# Patient Record
Sex: Female | Born: 1965
Health system: Southern US, Community
[De-identification: ages and names within clinical notes are randomized; demographics above are authoritative.]

## PROBLEM LIST (undated history)

## (undated) DIAGNOSIS — E039 Hypothyroidism, unspecified: Secondary | ICD-10-CM

## (undated) DIAGNOSIS — Z9109 Other allergy status, other than to drugs and biological substances: Secondary | ICD-10-CM

## (undated) DIAGNOSIS — F419 Anxiety disorder, unspecified: Secondary | ICD-10-CM

## (undated) HISTORY — DX: Hypothyroidism, unspecified: E03.9

## (undated) HISTORY — DX: Other allergy status, other than to drugs and biological substances: Z91.09

## (undated) HISTORY — DX: Anxiety disorder, unspecified: F41.9

---

## 2004-10-19 ENCOUNTER — Ambulatory Visit: Payer: Self-pay | Admitting: Internal Medicine

## 2005-11-01 ENCOUNTER — Ambulatory Visit: Payer: Self-pay | Admitting: Internal Medicine

## 2007-07-29 ENCOUNTER — Ambulatory Visit: Payer: Self-pay | Admitting: Internal Medicine

## 2009-09-27 ENCOUNTER — Ambulatory Visit: Payer: Self-pay | Admitting: Internal Medicine

## 2010-09-04 ENCOUNTER — Encounter (INDEPENDENT_AMBULATORY_CARE_PROVIDER_SITE_OTHER): Payer: PRIVATE HEALTH INSURANCE | Admitting: Ophthalmology

## 2010-09-04 DIAGNOSIS — D3130 Benign neoplasm of unspecified choroid: Secondary | ICD-10-CM

## 2010-11-07 ENCOUNTER — Ambulatory Visit: Payer: Self-pay

## 2011-03-07 ENCOUNTER — Ambulatory Visit (INDEPENDENT_AMBULATORY_CARE_PROVIDER_SITE_OTHER): Payer: PRIVATE HEALTH INSURANCE | Admitting: Ophthalmology

## 2011-03-19 ENCOUNTER — Ambulatory Visit (INDEPENDENT_AMBULATORY_CARE_PROVIDER_SITE_OTHER): Payer: PRIVATE HEALTH INSURANCE | Admitting: Ophthalmology

## 2011-03-19 DIAGNOSIS — H43819 Vitreous degeneration, unspecified eye: Secondary | ICD-10-CM

## 2011-03-19 DIAGNOSIS — D3130 Benign neoplasm of unspecified choroid: Secondary | ICD-10-CM

## 2011-09-17 ENCOUNTER — Ambulatory Visit (INDEPENDENT_AMBULATORY_CARE_PROVIDER_SITE_OTHER): Payer: PRIVATE HEALTH INSURANCE | Admitting: Ophthalmology

## 2011-09-17 DIAGNOSIS — H43819 Vitreous degeneration, unspecified eye: Secondary | ICD-10-CM

## 2011-09-17 DIAGNOSIS — D3130 Benign neoplasm of unspecified choroid: Secondary | ICD-10-CM

## 2011-11-08 ENCOUNTER — Ambulatory Visit: Payer: Self-pay

## 2012-03-19 ENCOUNTER — Ambulatory Visit (INDEPENDENT_AMBULATORY_CARE_PROVIDER_SITE_OTHER): Payer: PRIVATE HEALTH INSURANCE | Admitting: Ophthalmology

## 2012-11-18 ENCOUNTER — Ambulatory Visit: Payer: Self-pay

## 2015-02-21 ENCOUNTER — Ambulatory Visit
Admission: RE | Admit: 2015-02-21 | Discharge: 2015-02-21 | Disposition: A | Discharge status: Home / Self Care | Payer: Managed Care, Other (non HMO) | Source: Ambulatory Visit | Attending: Internal Medicine | Admitting: Internal Medicine

## 2015-02-21 ENCOUNTER — Other Ambulatory Visit: Payer: Self-pay | Admitting: Internal Medicine

## 2015-02-21 DIAGNOSIS — M79671 Pain in right foot: Secondary | ICD-10-CM

## 2017-01-08 ENCOUNTER — Ambulatory Visit: Payer: 59 | Admitting: Internal Medicine

## 2017-01-08 ENCOUNTER — Encounter: Payer: Self-pay | Admitting: Internal Medicine

## 2017-01-08 VITALS — BP 136/90 | HR 75 | Resp 16 | Ht 69.0 in | Wt 219.4 lb

## 2017-01-08 DIAGNOSIS — F411 Generalized anxiety disorder: Secondary | ICD-10-CM | POA: Insufficient documentation

## 2017-01-08 DIAGNOSIS — E039 Hypothyroidism, unspecified: Secondary | ICD-10-CM | POA: Diagnosis not present

## 2017-01-08 DIAGNOSIS — N926 Irregular menstruation, unspecified: Secondary | ICD-10-CM | POA: Insufficient documentation

## 2017-01-08 DIAGNOSIS — R635 Abnormal weight gain: Secondary | ICD-10-CM | POA: Insufficient documentation

## 2017-01-08 DIAGNOSIS — M722 Plantar fascial fibromatosis: Secondary | ICD-10-CM | POA: Insufficient documentation

## 2017-01-08 DIAGNOSIS — F325 Major depressive disorder, single episode, in full remission: Secondary | ICD-10-CM

## 2017-01-08 DIAGNOSIS — J309 Allergic rhinitis, unspecified: Secondary | ICD-10-CM | POA: Insufficient documentation

## 2017-01-08 DIAGNOSIS — E559 Vitamin D deficiency, unspecified: Secondary | ICD-10-CM | POA: Insufficient documentation

## 2017-01-08 NOTE — Progress Notes (Signed)
[facility] [place], [place] [address]  Internal MEDICINE  Office Visit Note  Patient Name: Chelsea Sheppard  [id number]  [id number]  Date of Service: 01/08/2017     Complaints/HPI:  Pt is feeling well. Mammogram and pap smear is up to date   Current Medication: Outpatient Encounter Medications as of 01/08/2017  Medication Sig  . albuterol (PROVENTIL HFA;VENTOLIN HFA) 108 (90 Base) MCG/ACT inhaler Inhale 2 puffs into the lungs every 4 (four) hours as needed for wheezing or shortness of breath.  . ALPRAZolam (XANAX) 0.5 MG tablet Take 0.5 mg by mouth at bedtime as needed for anxiety.  Marland Kitchen buPROPion (WELLBUTRIN) 75 MG tablet Take 75 mg by mouth 3 (three) times daily. 2 tablets po QAM and 1 tablet po QPM  . fluticasone (FLONASE) 50 MCG/ACT nasal spray Place 1 spray into both nostrils daily.  Marland Kitchen levothyroxine (SYNTHROID, LEVOTHROID) 125 MCG tablet Take 125 mcg by mouth daily before breakfast.   No facility-administered encounter medications on file as of 01/08/2017.     Surgical History: History reviewed. No pertinent surgical history.  Medical History: Past Medical History:  Diagnosis Date  . Environmental allergies   . Hypothyroidism     Family History: Family History  Problem Relation Age of Onset  . Diabetes Mother   . Thyroid disease Mother   . Hypertension Mother   . Kidney cancer Mother        tumor on kidney and had kidney removed  . Hypertension Father   . Prostate cancer Father       ROS  General: (-) fever, (-) chills, (-) night sweats, (-) weakness, (-) changes in appetite. Skin: (-) rashes, (-) itching,. Eyes: (-) visual changes, (-) redness, (-) itching, (-) double or blurred vision. Nose and Sinuses: (-) nasal stuffiness or itchiness, (-) postnasal drip, (-) nosebleeds, (-) sinus trouble. Mouth and Throat: (-) sore throat, (-) hoarseness. Neck: (-) swollen glands, (-) enlarged thyroid, (-) neck pain. Respiratory: (-) cough,  (-) bloody sputum, (-) shortness of breath, (-) wheezing. Cardiovascular: (-) ankle swelling, (-) chest pain. Lymphatic: (-) lymph node enlargement, (-) lymph node tenderness. Neurologic: (-) numbness, (-) tingling,(-) dizziness. Psychiatric: (-) anxiety, (-) depression.  Vital Signs: Blood pressure 136/90, pulse 75, resp. rate 16, height 5\' 9"  (1.753 m), weight 219 lb 6.4 oz (99.5 kg), SpO2 96 %.  Examination: General Appearance: The patient is well-developed, well-nourished, and in no distress. Skin: Gross inspection of skin demonstrates no evidence of abnormality. Head: Patient's head is normocephalic, no gross deformities. Eyes: no gross deformities noted. ENT: ears appear grossly normal. Nasopharynx appears to be normal. Neck: Supple. No thyromegaly. No LAD. Respiratory: Lungs are clear to auscultation with no adventitious sounds. Cardiovascular: Normal S1 and S2 without murmur or rub. Extremities: No cyanosis. pulses are equal. Neurologic: Alert and oriented. No involuntary movements.   Radiology: Dg Foot Complete Right  Result Date: 02/21/2015 CLINICAL DATA:  Right foot pain for 2 months.  No known injury. EXAM: RIGHT FOOT COMPLETE - 3+ VIEW COMPARISON:  None. FINDINGS: No acute bony or joint abnormality identified. No evidence fracture dislocation. Degenerative changes first MTP joint. IMPRESSION: Degenerative changes first MTP joint.  No acute abnormality. Electronically Signed   By: Marcello Moores  Register   On: 02/21/2015 13:55       Assessment and Plan: Patient Active Problem List   Diagnosis Date Noted  . Major depressive disorder, single episode, in remission ([place]) 01/08/2017  . Plantar fascial fibromatosis 01/08/2017  .  Abnormal weight gain 01/08/2017  . Vitamin D deficiency 01/08/2017  . Allergic rhinitis 01/08/2017  . Generalized anxiety disorder 01/08/2017  . Hypothyroidism 01/08/2017  . Irregular menstruation 01/08/2017  1. Major depressive disorder, single  episode, in remission ([place]) Continue all meds as before   2. Hypothyroidism, unspecified type  - T4, free - TSH If tsh is on the high side, might want to increase synthroid dose   General Counseling: I have discussed the findings of the evaluation and examination with Nevin Bloodgood.  I have also discussed any further diagnostic evaluation thatmay be needed or ordered today. Kearie verbalizes understanding of the findings of todays visit. We also reviewed her medications today and discussed drug interactions and side effects including but not limited excessive drowsiness and altered mental states. We also discussed that there is always a risk not just to her but also people around her. she has been encouraged to call the office with any questions or concerns that should arise related to todays visit.    Time spent 20  I have personally obtained a history, examined the patient, evaluated laboratory and imaging results, formulated the assessment and plan and placed orders.   Lavera Guise, MD  Internal Medicine

## 2017-04-01 ENCOUNTER — Other Ambulatory Visit: Payer: Self-pay

## 2017-04-01 MED ORDER — BUPROPION HCL 75 MG PO TABS: 75.0000 mg | ORAL_TABLET | Freq: Three times a day (TID) | ORAL | 1 refills | Status: DC

## 2017-07-02 LAB — T4, FREE: Free T4: 1.43 ng/dL (ref 0.82–1.77)

## 2017-07-02 LAB — TSH: TSH: 6.35 u[IU]/mL — ABNORMAL HIGH (ref 0.450–4.500)

## 2017-07-09 ENCOUNTER — Ambulatory Visit: Payer: Managed Care, Other (non HMO) | Admitting: Nurse Practitioner

## 2017-07-09 ENCOUNTER — Encounter: Payer: Self-pay | Admitting: Nurse Practitioner

## 2017-07-09 VITALS — BP 123/83 | HR 83 | Resp 16 | Ht 69.0 in | Wt 223.6 lb

## 2017-07-09 DIAGNOSIS — E559 Vitamin D deficiency, unspecified: Secondary | ICD-10-CM

## 2017-07-09 DIAGNOSIS — R635 Abnormal weight gain: Secondary | ICD-10-CM

## 2017-07-09 DIAGNOSIS — Z1239 Encounter for other screening for malignant neoplasm of breast: Secondary | ICD-10-CM

## 2017-07-09 DIAGNOSIS — Z1231 Encounter for screening mammogram for malignant neoplasm of breast: Secondary | ICD-10-CM

## 2017-07-09 DIAGNOSIS — Z124 Encounter for screening for malignant neoplasm of cervix: Secondary | ICD-10-CM | POA: Insufficient documentation

## 2017-07-09 DIAGNOSIS — F325 Major depressive disorder, single episode, in full remission: Secondary | ICD-10-CM

## 2017-07-09 DIAGNOSIS — E039 Hypothyroidism, unspecified: Secondary | ICD-10-CM

## 2017-07-09 MED ORDER — LEVOTHYROXINE SODIUM 137 MCG PO TABS: 125.0000 ug | ORAL_TABLET | Freq: Every day | ORAL | 1 refills | Status: DC

## 2017-07-09 MED ORDER — LEVOTHYROXINE SODIUM 137 MCG PO TABS: 125.0000 ug | ORAL_TABLET | Freq: Every day | ORAL | 3 refills | Status: DC

## 2017-07-09 NOTE — Progress Notes (Addendum)
[facility] [place], [place] [address]  Internal MEDICINE  Office Visit Note  Patient Name: Chelsea Sheppard  [id number]  [id number]  Date of Service: 07/09/2017   Pt is here for routine follow up.  Chief Complaint  Patient presents with  . Hypothyroidism    13month follow up  . Hypertension    pt believes blood pressure has been higher than it should be    Hypertension  This is a chronic problem. The current episode started more than 1 year ago. The problem is unchanged. The problem is controlled. Associated symptoms include palpitations. Pertinent negatives include no chest pain, neck pain or shortness of breath. Agents associated with hypertension include thyroid hormones. Risk factors for coronary artery disease include obesity, post-menopausal state and family history. Past treatments include lifestyle changes. The current treatment provides moderate improvement. There are no compliance problems.         Current Medication: Outpatient Encounter Medications as of 07/09/2017  Medication Sig  . albuterol (PROVENTIL HFA;VENTOLIN HFA) 108 (90 Base) MCG/ACT inhaler Inhale 2 puffs into the lungs every 4 (four) hours as needed for wheezing or shortness of breath.  . ALPRAZolam (XANAX) 0.5 MG tablet Take 0.5 mg by mouth at bedtime as needed for anxiety.  Marland Kitchen buPROPion (WELLBUTRIN) 75 MG tablet Take 1 tablet (75 mg total) by mouth 3 (three) times daily. 2 tablets po QAM and 1 tablet po QPM  . fluticasone (FLONASE) 50 MCG/ACT nasal spray Place 1 spray into both nostrils daily.  Marland Kitchen levothyroxine (SYNTHROID, LEVOTHROID) 137 MCG tablet Take 1 tablet (137 mcg total) by mouth daily before breakfast.  . [DISCONTINUED] levothyroxine (SYNTHROID, LEVOTHROID) 125 MCG tablet Take 125 mcg by mouth daily before breakfast.  . [DISCONTINUED] levothyroxine (SYNTHROID, LEVOTHROID) 137 MCG tablet Take 1 tablet (137 mcg total) by mouth daily before breakfast.   No facility-administered  encounter medications on file as of 07/09/2017.     Surgical History: History reviewed. No pertinent surgical history.  Medical History: Past Medical History:  Diagnosis Date  . Environmental allergies   . Hypothyroidism     Family History: Family History  Problem Relation Age of Onset  . Diabetes Mother   . Thyroid disease Mother   . Hypertension Mother   . Kidney cancer Mother        tumor on kidney and had kidney removed  . Hypertension Father   . Prostate cancer Father     Social History   Socioeconomic History  . Marital status: Unknown    Spouse name: Not on file  . Number of children: Not on file  . Years of education: Not on file  . Highest education level: Not on file  Occupational History  . Not on file  Social Needs  . Financial resource strain: Not on file  . Food insecurity:    Worry: Not on file    Inability: Not on file  . Transportation needs:    Medical: Not on file    Non-medical: Not on file  Tobacco Use  . Smoking status: Former Smoker    Types: Cigarettes  . Smokeless tobacco: Never Used  Substance and Sexual Activity  . Alcohol use: Yes    Comment: social  . Drug use: No  . Sexual activity: Not on file  Lifestyle  . Physical activity:    Days per week: Not on file    Minutes per session: Not on file  . Stress: Not on file  Relationships  .  Social connections:    Talks on phone: Not on file    Gets together: Not on file    Attends religious service: Not on file    Active member of club or organization: Not on file    Attends meetings of clubs or organizations: Not on file    Relationship status: Not on file  . Intimate partner violence:    Fear of current or ex partner: Not on file    Emotionally abused: Not on file    Physically abused: Not on file    Forced sexual activity: Not on file  Other Topics Concern  . Not on file  Social History Narrative  . Not on file      Review of Systems  Constitutional: Negative for  chills, fatigue and unexpected weight change.  HENT: Negative for congestion, postnasal drip, rhinorrhea, sneezing and sore throat.   Eyes: Negative.  Negative for redness.  Respiratory: Negative for cough, chest tightness and shortness of breath.   Cardiovascular: Positive for palpitations. Negative for chest pain.       Intermittent palpitations.   Gastrointestinal: Negative for abdominal pain, constipation, diarrhea, nausea and vomiting.  Endocrine: Negative for cold intolerance, heat intolerance, polydipsia, polyphagia and polyuria.       New labs indicate hypothyroid.  Genitourinary: Negative for dysuria and frequency.  Musculoskeletal: Negative for arthralgias, back pain, joint swelling and neck pain.  Skin: Negative for rash.  Allergic/Immunologic: Negative for environmental allergies.  Neurological: Negative.  Negative for tremors and numbness.  Hematological: Negative for adenopathy. Does not bruise/bleed easily.  Psychiatric/Behavioral: Negative for behavioral problems (Depression), sleep disturbance and suicidal ideas. The patient is nervous/anxious.        Well managed anxiety/depression on current medication.     Today's Vitals   07/09/17 0848  BP: 123/83  Pulse: 83  Resp: 16  SpO2: 96%  Weight: 223 lb 9.6 oz (101.4 kg)  Height: 5\' 9"  (1.753 m)    Physical Exam  Constitutional: She is oriented to person, place, and time. She appears well-developed and well-nourished. No distress.  HENT:  Head: Normocephalic and atraumatic.  Nose: Nose normal.  Mouth/Throat: Oropharynx is clear and moist. No oropharyngeal exudate.  Eyes: Pupils are equal, round, and reactive to light. Conjunctivae and EOM are normal.  Neck: Normal range of motion. Neck supple. No JVD present. No tracheal deviation present. No thyromegaly present.  Cardiovascular: Normal rate, regular rhythm and normal heart sounds. Exam reveals no gallop and no friction rub.  No murmur heard. Pulmonary/Chest: Effort  normal and breath sounds normal. No respiratory distress. She has no wheezes. She has no rales. She exhibits no tenderness.  Abdominal: Soft. Bowel sounds are normal. There is no tenderness.  Musculoskeletal: Normal range of motion.  Lymphadenopathy:    She has no cervical adenopathy.  Neurological: She is alert and oriented to person, place, and time. No cranial nerve deficit.  Skin: Skin is warm and dry. She is not diaphoretic.  Psychiatric: She has a normal mood and affect. Her behavior is normal. Judgment and thought content normal.  Nursing note and vitals reviewed.  Assessment/Plan:  1. Hypothyroidism, unspecified type Reviewed labs indicating hypothyroid. Increased levothyroxine to 115mcg daily. Recheck thyroid panel prior to next visit and adjust levothyroxine as indicated.  - CBC with Differential/Platelet; Future - Comprehensive metabolic panel; Future - T4, free; Future - TSH; Future - levothyroxine (SYNTHROID, LEVOTHROID) 137 MCG tablet; Take 1 tablet (137 mcg total) by mouth daily before breakfast.  Dispense:  90 tablet; Refill: 1  2. Major depressive disorder, single episode, in remission ([place]) Continue bupropion as prescribed.  - CBC with Differential/Platelet; Future - Comprehensive metabolic panel; Future  3. Abnormal weight gain Check labs. Adjust levothyroxine as indicated.  - Lipid panel; Future  4. Vitamin D deficiency - Vitamin D 1,25 dihydroxy; Future  5. Screening for breast cancer - MM DIGITAL SCREENING BILATERAL; Future  General Counseling: idara woodside understanding of the findings of todays visit and agrees with plan of treatment. I have discussed any further diagnostic evaluation that may be needed or ordered today. We also reviewed her medications today. she has been encouraged to call the office with any questions or concerns that should arise related to todays visit.    Counseling:    Orders Placed This Encounter  Procedures  . MM  DIGITAL SCREENING BILATERAL  . CBC with Differential/Platelet  . Comprehensive metabolic panel  . T4, free  . TSH  . Lipid panel  . Vitamin D 1,25 dihydroxy    Meds ordered this encounter  Medications  . DISCONTD: levothyroxine (SYNTHROID, LEVOTHROID) 137 MCG tablet    Sig: Take 1 tablet (137 mcg total) by mouth daily before breakfast.    Dispense:  30 tablet    Refill:  3    This is new, increased dose    Order Specific Question:   Supervising Provider    Answer:   Lavera Guise [place]  . levothyroxine (SYNTHROID, LEVOTHROID) 137 MCG tablet    Sig: Take 1 tablet (137 mcg total) by mouth daily before breakfast.    Dispense:  90 tablet    Refill:  1    This is new, increased dose    Order Specific Question:   Supervising Provider    Answer:   Lavera Guise [[id number]]    Time spent [place] [facility]

## 2017-08-15 ENCOUNTER — Encounter: Payer: Self-pay | Admitting: Radiology

## 2017-08-15 ENCOUNTER — Ambulatory Visit
Admission: RE | Admit: 2017-08-15 | Discharge: 2017-08-15 | Disposition: A | Discharge status: Home / Self Care | Payer: Managed Care, Other (non HMO) | Source: Ambulatory Visit | Attending: Nurse Practitioner | Admitting: Nurse Practitioner

## 2017-08-15 DIAGNOSIS — Z1239 Encounter for other screening for malignant neoplasm of breast: Secondary | ICD-10-CM

## 2017-08-15 DIAGNOSIS — Z1231 Encounter for screening mammogram for malignant neoplasm of breast: Secondary | ICD-10-CM | POA: Insufficient documentation

## 2017-08-19 ENCOUNTER — Other Ambulatory Visit: Payer: Self-pay | Admitting: Nurse Practitioner

## 2017-08-29 ENCOUNTER — Other Ambulatory Visit: Payer: Self-pay

## 2017-08-29 MED ORDER — BUPROPION HCL 75 MG PO TABS: 75.0000 mg | ORAL_TABLET | Freq: Three times a day (TID) | ORAL | 1 refills | Status: DC

## 2017-11-06 ENCOUNTER — Encounter: Payer: Self-pay | Admitting: Nurse Practitioner

## 2017-11-06 ENCOUNTER — Ambulatory Visit
Admission: RE | Admit: 2017-11-06 | Discharge: 2017-11-06 | Disposition: A | Discharge status: Home / Self Care | Payer: 59 | Source: Ambulatory Visit | Attending: Nurse Practitioner | Admitting: Nurse Practitioner

## 2017-11-06 ENCOUNTER — Ambulatory Visit: Payer: 59 | Admitting: Nurse Practitioner

## 2017-11-06 VITALS — BP 128/89 | HR 102 | Temp 96.2°F | Resp 16 | Ht 69.0 in | Wt 225.0 lb

## 2017-11-06 DIAGNOSIS — M25562 Pain in left knee: Secondary | ICD-10-CM | POA: Diagnosis present

## 2017-11-06 DIAGNOSIS — W101XXA Fall (on)(from) sidewalk curb, initial encounter: Secondary | ICD-10-CM | POA: Insufficient documentation

## 2017-11-06 DIAGNOSIS — M25462 Effusion, left knee: Secondary | ICD-10-CM | POA: Diagnosis not present

## 2017-11-06 MED ORDER — DICLOFENAC SODIUM 1 % TD GEL: 4.0000 g | Freq: Four times a day (QID) | TRANSDERMAL | 1 refills | Status: AC

## 2017-11-06 MED ORDER — TRAMADOL HCL 50 MG PO TABS: 50.0000 mg | ORAL_TABLET | Freq: Two times a day (BID) | ORAL | 0 refills | Status: DC | PRN

## 2017-11-06 NOTE — Progress Notes (Signed)
[facility] [place], [place] [address]  Internal MEDICINE  Office Visit Note  Patient Name: Chelsea Sheppard  [id number]  [id number]  Date of Service: 11/06/2017   Pt is here for a sick visit.  Chief Complaint  Patient presents with  . Hypothyroidism  . Knee Pain    left side     The patient states that she initially fell back in July while walking her dogs. Did talk to tele-doc through her insurance. Was told to ice and elevate her leg. Was also advised to use knee brace. She states that after several weeks, this improved. Then, last week, while walking with friends in the sidewalk in [place], she stepped up on the curb. She did not twist her knee, felt no pop, did not fall, but left knee began to hurt severely. By the next day, she was barely able to put weight on her leg at all. Hurts along the inner aspect and the lower part of the patella.   Knee Pain   The incident occurred 5 to 7 days ago. The incident occurred in the street. The injury mechanism is unknown. The pain is present in the left knee (lower left leg. ). The quality of the pain is described as aching and stabbing. The pain is at a severity of 7/10. The pain is moderate. The pain has been fluctuating since onset. Pertinent negatives include no numbness. She reports no foreign bodies present. The symptoms are aggravated by movement and weight bearing. She has tried ice, immobilization, NSAIDs and non-weight bearing for the symptoms. The treatment provided mild relief.        Current Medication:  Outpatient Encounter Medications as of 11/06/2017  Medication Sig  . albuterol (PROVENTIL HFA;VENTOLIN HFA) 108 (90 Base) MCG/ACT inhaler Inhale 2 puffs into the lungs every 4 (four) hours as needed for wheezing or shortness of breath.  . ALPRAZolam (XANAX) 0.5 MG tablet Take 0.5 mg by mouth at bedtime as needed for anxiety.  Marland Kitchen buPROPion (WELLBUTRIN) 75 MG tablet Take 1 tablet (75 mg total) by mouth  3 (three) times daily. 2 tablets po QAM and 1 tablet po QPM  . fluticasone (FLONASE) 50 MCG/ACT nasal spray Place 1 spray into both nostrils daily.  Marland Kitchen levothyroxine (SYNTHROID, LEVOTHROID) 137 MCG tablet Take 1 tablet (137 mcg total) by mouth daily before breakfast.  . diclofenac sodium (VOLTAREN) 1 % GEL Apply 4 g topically 4 (four) times daily.  . traMADol (ULTRAM) 50 MG tablet Take 1 tablet (50 mg total) by mouth every 12 (twelve) hours as needed.   No facility-administered encounter medications on file as of 11/06/2017.       Medical History: Past Medical History:  Diagnosis Date  . Environmental allergies   . Hypothyroidism      Vital Signs: BP 128/89   Pulse (!) 102   Temp (!) 96.2 F (35.7 C)   Resp 16   Ht 5\' 9"  (1.753 m)   Wt 225 lb (102.1 kg)   LMP 10/27/2017   SpO2 98%   BMI 33.23 kg/m    Review of Systems  Constitutional: Positive for activity change. Negative for chills, fatigue and unexpected weight change.       Hurting to walk and is walking with considerable limp, favoring the left side .  HENT: Negative for congestion.   Eyes: Negative for redness.  Respiratory: Negative for cough, chest tightness and shortness of breath.   Cardiovascular: Negative for chest pain and palpitations.  Musculoskeletal: Positive for arthralgias. Negative for back pain, joint swelling and neck pain.       Moderate to severe pain in left knee. Hurts most with any weight on the left leg. Walking is extremely painful.   Skin: Negative for rash.  Neurological: Negative.  Negative for tremors and numbness.  Hematological: Negative for adenopathy. Does not bruise/bleed easily.  Psychiatric/Behavioral: Behavioral problem: Depression.    Physical Exam  Constitutional: She is oriented to person, place, and time. She appears well-developed and well-nourished. No distress.  HENT:  Head: Normocephalic and atraumatic.  Mouth/Throat: No oropharyngeal exudate.  Eyes: Pupils are  equal, round, and reactive to light. EOM are normal.  Neck: Normal range of motion. Neck supple. No JVD present. No tracheal deviation present. No thyromegaly present.  Cardiovascular: Normal rate, regular rhythm and normal heart sounds. Exam reveals no gallop and no friction rub.  No murmur heard. Pulmonary/Chest: No respiratory distress. She has no wheezes. She has no rales. She exhibits no tenderness.  Musculoskeletal: Normal range of motion.       Legs: Lymphadenopathy:    She has no cervical adenopathy.  Neurological: She is alert and oriented to person, place, and time. No cranial nerve deficit.  Skin: Skin is warm and dry. She is not diaphoretic.  Psychiatric: She has a normal mood and affect.  Nursing note and vitals reviewed.  Assessment/Plan: 1. Fall (on)(from) sidewalk curb, initial encounter Will get x-ray of left knee for further evaluation. Will consider MRI and/or refer to orthopedics as indicated.  - DG Knee Complete 4 Views Left; Future  2. Acute pain of left knee Will get x-ray of left knee for further evaluation. Will consider MRI and/or refer to orthopedics as indicated. Prescription for voltaren gel may be used as needed for pain. Tramadol 50mg  written for twice daily, however, patient to use only at bedtime. A prescription for #10 tablets was sent to her pharmacy.  - DG Knee Complete 4 Views Left; Future   General Counseling: Chelsea Sheppard understanding of the findings of todays visit and agrees with plan of treatment. I have discussed any further diagnostic evaluation that may be needed or ordered today. We also reviewed her medications today. she has been encouraged to call the office with any questions or concerns that should arise related to todays visit.    Counseling:  Reviewed risks and possible side effects associated with taking opiates, benzodiazepines and other CNS depressants. Combination of these could cause dizziness and drowsiness. Advised patient  not to drive or operate machinery when taking these medications, as patient's and other's life can be at risk and will have consequences. Patient verbalized understanding in this matter. Dependence and abuse for these drugs will be monitored closely. A Controlled substance policy and procedure is on file which allows [facility] to order a urine drug screen test at any visit. Patient understands and agrees with the plan  This patient was seen by Leretha Pol FNP Collaboration with Dr Lavera Guise as a part of collaborative care agreement  Orders Placed This Encounter  Procedures  . DG Knee Complete 4 Views Left    Meds ordered this encounter  Medications  . diclofenac sodium (VOLTAREN) 1 % GEL    Sig: Apply 4 g topically 4 (four) times daily.    Dispense:  100 g    Refill:  1    Order Specific Question:   Supervising Provider    Answer:   Lavera Guise [[id number]]  .  traMADol (ULTRAM) 50 MG tablet    Sig: Take 1 tablet (50 mg total) by mouth every 12 (twelve) hours as needed.    Dispense:  10 tablet    Refill:  0    Order Specific Question:   Supervising Provider    Answer:   Lavera Guise [[id number]]    Time spent: 15 Minutes

## 2017-11-07 ENCOUNTER — Telehealth: Payer: Self-pay

## 2017-11-07 NOTE — Telephone Encounter (Signed)
Pt advised for xrays result and send beth reminder for ortho referral

## 2017-12-04 ENCOUNTER — Other Ambulatory Visit: Payer: Self-pay

## 2017-12-04 MED ORDER — BUPROPION HCL 75 MG PO TABS: 75.0000 mg | ORAL_TABLET | Freq: Three times a day (TID) | ORAL | 1 refills | Status: DC

## 2017-12-18 ENCOUNTER — Other Ambulatory Visit: Payer: Self-pay

## 2017-12-18 DIAGNOSIS — E039 Hypothyroidism, unspecified: Secondary | ICD-10-CM

## 2017-12-18 MED ORDER — LEVOTHYROXINE SODIUM 137 MCG PO TABS: 125.0000 ug | ORAL_TABLET | Freq: Every day | ORAL | 2 refills | Status: DC

## 2018-01-17 ENCOUNTER — Encounter: Payer: Self-pay | Admitting: Nurse Practitioner

## 2018-01-24 ENCOUNTER — Other Ambulatory Visit: Payer: Self-pay | Admitting: Nurse Practitioner

## 2018-01-25 LAB — CBC
HEMATOCRIT: 42.5 % (ref 34.0–46.6)
Hemoglobin: 14.4 g/dL (ref 11.1–15.9)
MCH: 31 pg (ref 26.6–33.0)
MCHC: 33.9 g/dL (ref 31.5–35.7)
MCV: 92 fL (ref 79–97)
Platelets: 237 10*3/uL (ref 150–450)
RBC: 4.64 x10E6/uL (ref 3.77–5.28)
RDW: 11.5 % — ABNORMAL LOW (ref 12.3–15.4)
WBC: 7.7 10*3/uL (ref 3.4–10.8)

## 2018-01-25 LAB — COMPREHENSIVE METABOLIC PANEL
ALBUMIN: 3.8 g/dL (ref 3.5–5.5)
ALK PHOS: 57 IU/L (ref 39–117)
ALT: 22 IU/L (ref 0–32)
AST: 20 IU/L (ref 0–40)
Albumin/Globulin Ratio: 1.7 (ref 1.2–2.2)
BUN / CREAT RATIO: 13 (ref 9–23)
BUN: 7 mg/dL (ref 6–24)
Bilirubin Total: 0.5 mg/dL (ref 0.0–1.2)
CO2: 21 mmol/L (ref 20–29)
CREATININE: 0.52 mg/dL — AB (ref 0.57–1.00)
Calcium: 9.1 mg/dL (ref 8.7–10.2)
Chloride: 104 mmol/L (ref 96–106)
GFR calc Af Amer: 127 mL/min/{1.73_m2} (ref >59)
GFR, EST NON AFRICAN AMERICAN: 110 mL/min/{1.73_m2} (ref >59)
GLUCOSE: 98 mg/dL (ref 65–99)
Globulin, Total: 2.3 g/dL (ref 1.5–4.5)
Potassium: 4.3 mmol/L (ref 3.5–5.2)
Sodium: 138 mmol/L (ref 134–144)
Total Protein: 6.1 g/dL (ref 6.0–8.5)

## 2018-01-25 LAB — T4, FREE: FREE T4: 1.6 ng/dL (ref 0.82–1.77)

## 2018-01-25 LAB — LIPID PANEL W/O CHOL/HDL RATIO
Cholesterol, Total: 200 mg/dL — ABNORMAL HIGH (ref 100–199)
HDL: 63 mg/dL (ref >39)
LDL Calculated: 121 mg/dL — ABNORMAL HIGH (ref 0–99)
Triglycerides: 81 mg/dL (ref 0–149)
VLDL CHOLESTEROL CAL: 16 mg/dL (ref 5–40)

## 2018-01-25 LAB — TSH: TSH: 3.44 u[IU]/mL (ref 0.450–4.500)

## 2018-01-25 LAB — VITAMIN D 25 HYDROXY (VIT D DEFICIENCY, FRACTURES): Vit D, 25-Hydroxy: 27.4 ng/mL — ABNORMAL LOW (ref 30.0–100.0)

## 2018-01-31 ENCOUNTER — Encounter: Payer: Self-pay | Admitting: Nurse Practitioner

## 2018-01-31 ENCOUNTER — Ambulatory Visit (INDEPENDENT_AMBULATORY_CARE_PROVIDER_SITE_OTHER): Payer: 59 | Admitting: Nurse Practitioner

## 2018-01-31 VITALS — BP 124/80 | HR 93 | Resp 16 | Ht 69.0 in | Wt 225.6 lb

## 2018-01-31 DIAGNOSIS — F411 Generalized anxiety disorder: Secondary | ICD-10-CM

## 2018-01-31 DIAGNOSIS — R300 Dysuria: Secondary | ICD-10-CM

## 2018-01-31 DIAGNOSIS — Z0001 Encounter for general adult medical examination with abnormal findings: Secondary | ICD-10-CM

## 2018-01-31 DIAGNOSIS — Z124 Encounter for screening for malignant neoplasm of cervix: Secondary | ICD-10-CM

## 2018-01-31 DIAGNOSIS — F325 Major depressive disorder, single episode, in full remission: Secondary | ICD-10-CM

## 2018-01-31 DIAGNOSIS — E039 Hypothyroidism, unspecified: Secondary | ICD-10-CM | POA: Diagnosis not present

## 2018-01-31 MED ORDER — ALPRAZOLAM 0.5 MG PO TABS: 0.5000 mg | ORAL_TABLET | Freq: Every evening | ORAL | 1 refills | Status: DC | PRN

## 2018-01-31 MED ORDER — BUPROPION HCL 75 MG PO TABS: 75.0000 mg | ORAL_TABLET | Freq: Two times a day (BID) | ORAL | 1 refills | Status: DC

## 2018-01-31 NOTE — Progress Notes (Signed)
[facility] [place], [place] [id number]  Internal MEDICINE  Office Visit Note  Patient Name: Chelsea Sheppard  [id number]  [id number]  Date of Service: 02/05/2018   Pt is here for routine health maintenance examination  Chief Complaint  Patient presents with  . Annual Exam    6 month physical  . Gynecologic Exam  . Quality Metric Gaps    colonoscopy due     Patient here for health maintenance exam. She is due to have pap smear today. She is having some increased depression. Had close family member pass away and since then has had some difficulty feeling interested in normal, fun activities. Denies feelings of self harm, denies feeling tearful. Just feels down and hard to pick herself up. She is otherwise feeling well. She has had her routine labs drawn. Results were good.     Current Medication: Outpatient Encounter Medications as of 01/31/2018  Medication Sig  . albuterol (PROVENTIL HFA;VENTOLIN HFA) 108 (90 Base) MCG/ACT inhaler Inhale 2 puffs into the lungs every 4 (four) hours as needed for wheezing or shortness of breath.  . ALPRAZolam (XANAX) 0.5 MG tablet Take 1 tablet (0.5 mg total) by mouth at bedtime as needed for anxiety.  . diclofenac sodium (VOLTAREN) 1 % GEL Apply 4 g topically 4 (four) times daily.  . fluticasone (FLONASE) 50 MCG/ACT nasal spray Place 1 spray into both nostrils daily.  Marland Kitchen levothyroxine (SYNTHROID, LEVOTHROID) 137 MCG tablet Take 1 tablet (137 mcg total) by mouth daily before breakfast.  . traMADol (ULTRAM) 50 MG tablet Take 1 tablet (50 mg total) by mouth every 12 (twelve) hours as needed.  . [DISCONTINUED] ALPRAZolam (XANAX) 0.5 MG tablet Take 0.5 mg by mouth at bedtime as needed for anxiety.  . [DISCONTINUED] buPROPion (WELLBUTRIN) 75 MG tablet Take 1 tablet (75 mg total) by mouth 3 (three) times daily. 2 tablets po QAM and 1 tablet po QPM  . [DISCONTINUED] buPROPion (WELLBUTRIN) 75 MG tablet Take 1 tablet (75 mg total) by  mouth 2 (two) times daily. 2 tablets po QAM and 1 tablet po QPM   No facility-administered encounter medications on file as of 01/31/2018.     Surgical History: History reviewed. No pertinent surgical history.  Medical History: Past Medical History:  Diagnosis Date  . Environmental allergies   . Hypothyroidism     Family History: Family History  Problem Relation Age of Onset  . Diabetes Mother   . Thyroid disease Mother   . Hypertension Mother   . Kidney cancer Mother        tumor on kidney and had kidney removed  . Hypertension Father   . Prostate cancer Father   . Breast cancer Maternal Aunt 19  . Breast cancer Cousin 64       pat cousin      Review of Systems  Constitutional: Positive for fatigue. Negative for chills and unexpected weight change.  HENT: Negative for congestion, postnasal drip, rhinorrhea, sneezing and sore throat.   Eyes: Negative for pain, discharge, redness, itching and visual disturbance.  Respiratory: Negative for cough, chest tightness and shortness of breath.   Cardiovascular: Negative for chest pain and palpitations.  Gastrointestinal: Negative for abdominal pain, constipation, diarrhea, nausea and vomiting.  Endocrine: Negative for cold intolerance, heat intolerance, polydipsia and polyuria.       Thyroid panel good.   Genitourinary: Negative for dysuria and frequency.  Musculoskeletal: Negative for arthralgias, back pain, joint swelling and neck pain.  Skin:  Negative for rash.  Allergic/Immunologic: Negative for environmental allergies.  Neurological: Negative for dizziness, tremors, numbness and headaches.  Hematological: Negative for adenopathy. Does not bruise/bleed easily.  Psychiatric/Behavioral: Positive for dysphoric mood. Negative for behavioral problems (Depression), sleep disturbance and suicidal ideas. The patient is nervous/anxious.      Today's Vitals   01/31/18 1024  BP: 124/80  Pulse: 93  Resp: 16  SpO2: 98%  Weight:  225 lb 9.6 oz (102.3 kg)  Height: 5\' 9"  (1.753 m)    Physical Exam Vitals signs and nursing note reviewed.  Constitutional:      General: She is not in acute distress.    Appearance: Normal appearance. She is well-developed. She is not diaphoretic.  HENT:     Head: Normocephalic and atraumatic.     Mouth/Throat:     Pharynx: No oropharyngeal exudate.  Eyes:     Conjunctiva/sclera: Conjunctivae normal.     Pupils: Pupils are equal, round, and reactive to light.  Neck:     Musculoskeletal: Normal range of motion and neck supple.     Thyroid: No thyromegaly.     Vascular: No carotid bruit or JVD.     Trachea: No tracheal deviation.  Cardiovascular:     Rate and Rhythm: Normal rate and regular rhythm.     Pulses: Normal pulses.     Heart sounds: Normal heart sounds. No murmur. No friction rub. No gallop.   Pulmonary:     Effort: Pulmonary effort is normal. No respiratory distress.     Breath sounds: Normal breath sounds. No wheezing or rales.  Chest:     Chest wall: No tenderness.     Breasts:        Right: Normal. No swelling, bleeding, inverted nipple, mass, nipple discharge, skin change or tenderness.        Left: Normal. No swelling, bleeding, inverted nipple, mass, nipple discharge, skin change or tenderness.  Abdominal:     General: Bowel sounds are normal.     Palpations: Abdomen is soft.     Tenderness: There is no abdominal tenderness.  Genitourinary:    General: Normal vulva.     Comments: No tenderness, masses, or organomeglay present during bimanual exam . Musculoskeletal: Normal range of motion.  Lymphadenopathy:     Cervical: No cervical adenopathy.  Skin:    General: Skin is warm and dry.  Neurological:     Mental Status: She is alert and oriented to person, place, and time.     Cranial Nerves: No cranial nerve deficit.  Psychiatric:        Behavior: Behavior normal.        Thought Content: Thought content normal.        Judgment: Judgment normal.       LABS: Recent Results (from the past 2160 hour(s))  Comprehensive metabolic panel     Status: Abnormal   Collection Time: 01/24/18  8:28 AM  Result Value Ref Range   Glucose 98 65 - 99 mg/dL   BUN 7 6 - 24 mg/dL   Creatinine, Ser 0.52 (L) 0.57 - 1.00 mg/dL   GFR calc non Af Amer 110 >59 mL/min/1.73   GFR calc Af Amer 127 >59 mL/min/1.73   BUN/Creatinine Ratio 13 9 - 23   Sodium 138 134 - 144 mmol/L   Potassium 4.3 3.5 - 5.2 mmol/L   Chloride 104 96 - 106 mmol/L   CO2 21 20 - 29 mmol/L   Calcium 9.1 8.7 - 10.2 mg/dL  Total Protein 6.1 6.0 - 8.5 g/dL   Albumin 3.8 3.5 - 5.5 g/dL   Globulin, Total 2.3 1.5 - 4.5 g/dL   Albumin/Globulin Ratio 1.7 1.2 - 2.2   Bilirubin Total 0.5 0.0 - 1.2 mg/dL   Alkaline Phosphatase 57 39 - 117 IU/L   AST 20 0 - 40 IU/L   ALT 22 0 - 32 IU/L  CBC     Status: Abnormal   Collection Time: 01/24/18  8:28 AM  Result Value Ref Range   WBC 7.7 3.4 - 10.8 x10E3/uL   RBC 4.64 3.77 - 5.28 x10E6/uL   Hemoglobin 14.4 11.1 - 15.9 g/dL   Hematocrit 42.5 34.0 - 46.6 %   MCV 92 79 - 97 fL   MCH 31.0 26.6 - 33.0 pg   MCHC 33.9 31.5 - 35.7 g/dL   RDW 11.5 (L) 12.3 - 15.4 %    Comment: **Effective January 27, 2018, the RDW pediatric reference**   interval will be removed and the adult reference interval   will be changing to:                             Female 11.7 - 15.4                                                      Female 11.6 - 15.4    Platelets 237 150 - 450 x10E3/uL  Lipid Panel w/o Chol/HDL Ratio     Status: Abnormal   Collection Time: 01/24/18  8:28 AM  Result Value Ref Range   Cholesterol, Total 200 (H) 100 - 199 mg/dL   Triglycerides 81 0 - 149 mg/dL   HDL 63 >39 mg/dL   VLDL Cholesterol Cal 16 5 - 40 mg/dL   LDL Calculated 121 (H) 0 - 99 mg/dL  T4, free     Status: None   Collection Time: 01/24/18  8:28 AM  Result Value Ref Range   Free T4 1.60 0.82 - 1.77 ng/dL  TSH     Status: None   Collection Time: 01/24/18  8:28 AM  Result  Value Ref Range   TSH 3.440 0.450 - 4.500 uIU/mL  VITAMIN D 25 Hydroxy (Vit-D Deficiency, Fractures)     Status: Abnormal   Collection Time: 01/24/18  8:28 AM  Result Value Ref Range   Vit D, 25-Hydroxy 27.4 (L) 30.0 - 100.0 ng/mL    Comment: Vitamin D deficiency has been defined by the [place] and an Endocrine Society practice guideline as a level of serum 25-OH vitamin D less than 20 ng/mL (1,2). The Endocrine Society went on to further define vitamin D insufficiency as a level between 21 and 29 ng/mL (2). 1. IOM (Institute of Medicine). 2010. Dietary reference    intakes for calcium and D. [place]: The    [employer]. 2. Holick MF, Binkley [place], Bischoff-Ferrari HA, et al.    Evaluation, treatment, and prevention of vitamin D    deficiency: an Endocrine Society clinical practice    guideline. JCEM. 2011 Jul; 96(7):1911-30.   Pap IG and HPV (high risk) DNA detection     Status: None   Collection Time: 01/31/18 10:33 AM  Result Value Ref Range   Interpretation NILM     Comment: NEGATIVE  FOR INTRAEPITHELIAL LESION OR MALIGNANCY.   Category NIL     Comment: Negative for Intraepithelial Lesion   Adequacy SECNI     Comment: Satisfactory for evaluation. No endocervical component is identified.   Clinician Provided ICD10 Comment     Comment: Z12.4   Performed by: Comment     Comment: Raiford Noble, Cytotechnologist (ASCP)   Note: Comment     Comment: The Pap smear is a screening test designed to aid in the detection of premalignant and malignant conditions of the uterine cervix.  It is not a diagnostic procedure and should not be used as the sole means of detecting cervical cancer.  Both false-positive and false-negative reports do occur.    Test Methodology Comment     Comment: This liquid based ThinPrep(R) pap test was screened with the use of an image guided system.    HPV, high-risk Negative Negative    Comment: This nucleic acid  amplification high-risk HPV test detects thirteen high-risk types (16,18,31,33,35,39,45,51,52,56,58,59,68) without differentiation.   UA/M w/rflx Culture, Routine     Status: None   Collection Time: 01/31/18 11:38 AM  Result Value Ref Range   Specific Gravity, UA 1.015 1.005 - 1.030   pH, UA 7.0 5.0 - 7.5   Color, UA Yellow Yellow   Appearance Ur Clear Clear   Leukocytes, UA Negative Negative   Protein, UA Negative Negative/Trace   Glucose, UA Negative Negative   Ketones, UA Negative Negative   RBC, UA Negative Negative   Bilirubin, UA Negative Negative   Urobilinogen, Ur 0.2 0.2 - 1.0 mg/dL   Nitrite, UA Negative Negative   Microscopic Examination Comment     Comment: Microscopic follows if indicated.   Microscopic Examination See below:     Comment: Microscopic was indicated and was performed.   Urinalysis Reflex Comment     Comment: This specimen will not reflex to a Urine Culture.  Microscopic Examination     Status: None   Collection Time: 01/31/18 11:38 AM  Result Value Ref Range   WBC, UA 0-5 0 - 5 /hpf   RBC, UA 0-2 0 - 2 /hpf   Epithelial Cells (non renal) 0-10 0 - 10 /hpf   Casts None seen None seen /lpf   Mucus, UA Present Not Estab.   Bacteria, UA Few None seen/Few   Assessment/Plan: 1. Encounter for general adult medical examination with abnormal findings Annual health maintenance exam with pap smear today.  2. Hypothyroidism, unspecified type Thyroid panel stable. Continue levothyroxine at current dose   3. Major depressive disorder, single episode, in remission ([place]) Continue bupropion as prescribed. Encouraged her to take second daily dose in afternoons to help recent down feelings. Reassess at next visit.   4. Generalized anxiety disorder May ake alprazolam 0.5mg  at bedtime as needed for anxiety/insomnia. New prescription sent today.  - ALPRAZolam (XANAX) 0.5 MG tablet; Take 1 tablet (0.5 mg total) by mouth at bedtime as needed for anxiety.  Dispense:  90 tablet; Refill: 1  5. Routine cervical smear - Pap IG and HPV (high risk) DNA detection  6. Dysuria - UA/M w/rflx Culture, Routine  General Counseling: Ellyanna verbalizes understanding of the findings of todays visit and agrees with plan of treatment. I have discussed any further diagnostic evaluation that may be needed or ordered today. We also reviewed her medications today. she has been encouraged to call the office with any questions or concerns that should arise related to todays visit.    Counseling:  This  patient was seen by Leretha Pol FNP Collaboration with Dr Lavera Guise as a part of collaborative care agreement  Orders Placed This Encounter  Procedures  . Microscopic Examination  . UA/M w/rflx Culture, Routine    Meds ordered this encounter  Medications  . ALPRAZolam (XANAX) 0.5 MG tablet    Sig: Take 1 tablet (0.5 mg total) by mouth at bedtime as needed for anxiety.    Dispense:  90 tablet    Refill:  1    This is for 90 day prescription    Order Specific Question:   Supervising Provider    Answer:   Lavera Guise [place]  . DISCONTD: buPROPion (WELLBUTRIN) 75 MG tablet    Sig: Take 1 tablet (75 mg total) by mouth 2 (two) times daily. 2 tablets po QAM and 1 tablet po QPM    Dispense:  180 tablet    Refill:  1    Order Specific Question:   Supervising Provider    Answer:   Lavera Guise [[id number]]    Time spent: [place], MD  Internal Medicine

## 2018-02-01 LAB — UA/M W/RFLX CULTURE, ROUTINE
Bilirubin, UA: NEGATIVE
GLUCOSE, UA: NEGATIVE
KETONES UA: NEGATIVE
Leukocytes, UA: NEGATIVE
Nitrite, UA: NEGATIVE
Protein, UA: NEGATIVE
RBC, UA: NEGATIVE
Specific Gravity, UA: 1.015 (ref 1.005–1.030)
Urobilinogen, Ur: 0.2 mg/dL (ref 0.2–1.0)
pH, UA: 7 (ref 5.0–7.5)

## 2018-02-01 LAB — MICROSCOPIC EXAMINATION: Casts: NONE SEEN /lpf

## 2018-02-03 ENCOUNTER — Other Ambulatory Visit: Payer: Self-pay

## 2018-02-03 DIAGNOSIS — F325 Major depressive disorder, single episode, in full remission: Secondary | ICD-10-CM

## 2018-02-03 MED ORDER — BUPROPION HCL 75 MG PO TABS: 75.0000 mg | ORAL_TABLET | Freq: Two times a day (BID) | ORAL | 1 refills | Status: DC

## 2018-02-04 LAB — PAP IG AND HPV HIGH-RISK: HPV, high-risk: NEGATIVE

## 2018-02-05 DIAGNOSIS — R300 Dysuria: Secondary | ICD-10-CM | POA: Insufficient documentation

## 2018-02-06 ENCOUNTER — Other Ambulatory Visit: Payer: Self-pay | Admitting: Nurse Practitioner

## 2018-02-06 DIAGNOSIS — F325 Major depressive disorder, single episode, in full remission: Secondary | ICD-10-CM

## 2018-02-06 MED ORDER — BUPROPION HCL 75 MG PO TABS: ORAL_TABLET | ORAL | 1 refills | Status: DC

## 2018-02-10 ENCOUNTER — Other Ambulatory Visit: Payer: Self-pay

## 2018-02-10 DIAGNOSIS — F325 Major depressive disorder, single episode, in full remission: Secondary | ICD-10-CM

## 2018-02-10 MED ORDER — BUPROPION HCL 75 MG PO TABS: ORAL_TABLET | ORAL | 1 refills | Status: DC

## 2018-08-05 ENCOUNTER — Ambulatory Visit (INDEPENDENT_AMBULATORY_CARE_PROVIDER_SITE_OTHER): Payer: 59 | Admitting: Nurse Practitioner

## 2018-08-05 ENCOUNTER — Encounter: Payer: Self-pay | Admitting: Nurse Practitioner

## 2018-08-05 ENCOUNTER — Other Ambulatory Visit: Payer: Self-pay

## 2018-08-05 VITALS — BP 142/98 | HR 87 | Ht 69.0 in | Wt 230.0 lb

## 2018-08-05 DIAGNOSIS — E039 Hypothyroidism, unspecified: Secondary | ICD-10-CM | POA: Diagnosis not present

## 2018-08-05 DIAGNOSIS — F411 Generalized anxiety disorder: Secondary | ICD-10-CM | POA: Diagnosis not present

## 2018-08-05 DIAGNOSIS — F325 Major depressive disorder, single episode, in full remission: Secondary | ICD-10-CM | POA: Diagnosis not present

## 2018-08-05 MED ORDER — LEVOTHYROXINE SODIUM 137 MCG PO TABS: 125.0000 ug | ORAL_TABLET | Freq: Every day | ORAL | 2 refills | Status: DC

## 2018-08-05 NOTE — Progress Notes (Signed)
[facility] [place], [place] [address]  Internal MEDICINE  Telephone Visit  Patient Name: Chelsea Sheppard  [id number]  [id number]  Date of Service: 08/06/2018  I connected with the patient at 12:44pm by webcam and verified the patients identity using two identifiers.   I discussed the limitations, risks, security and privacy concerns of performing an evaluation and management service by webcam and the availability of in person appointments. I also discussed with the patient that there may be a patient responsible charge related to the service.  The patient expressed understanding and agrees to proceed.    Chief Complaint  Patient presents with  . Telephone Assessment  . Telephone Screen  . Allergic Rhinitis   . Hypothyroidism    The patient has been contacted via webcam for follow up visit due to concerns for spread of novel coronavirus. The patient admits to having increased levels of anxiety since onset of COVID 19. She had been able to wean back her wellutrin to morning doses only. Since restrictions were put into effect, she has had to icrease back to twice daily dosing. She admits to using her alprazolam on as needed basis. She uses this only at night to help her sleep if needed.       Current Medication: Outpatient Encounter Medications as of 08/05/2018  Medication Sig  . albuterol (PROVENTIL HFA;VENTOLIN HFA) 108 (90 Base) MCG/ACT inhaler Inhale 2 puffs into the lungs every 4 (four) hours as needed for wheezing or shortness of breath.  . ALPRAZolam (XANAX) 0.5 MG tablet Take 1 tablet (0.5 mg total) by mouth at bedtime as needed for anxiety.  Marland Kitchen buPROPion (WELLBUTRIN) 75 MG tablet 2 tablets po QAM  . diclofenac sodium (VOLTAREN) 1 % GEL Apply 4 g topically 4 (four) times daily.  . fluticasone (FLONASE) 50 MCG/ACT nasal spray Place 1 spray into both nostrils daily.  Marland Kitchen levothyroxine (SYNTHROID) 137 MCG tablet Take 1 tablet (137 mcg total) by mouth daily  before breakfast.  . traMADol (ULTRAM) 50 MG tablet Take 1 tablet (50 mg total) by mouth every 12 (twelve) hours as needed.  . [DISCONTINUED] levothyroxine (SYNTHROID, LEVOTHROID) 137 MCG tablet Take 1 tablet (137 mcg total) by mouth daily before breakfast.   No facility-administered encounter medications on file as of 08/05/2018.     Surgical History: History reviewed. No pertinent surgical history.  Medical History: Past Medical History:  Diagnosis Date  . Environmental allergies   . Hypothyroidism     Family History: Family History  Problem Relation Age of Onset  . Diabetes Mother   . Thyroid disease Mother   . Hypertension Mother   . Kidney cancer Mother        tumor on kidney and had kidney removed  . Hypertension Father   . Prostate cancer Father   . Breast cancer Maternal Aunt 27  . Breast cancer Cousin 56       pat cousin    Social History   Socioeconomic History  . Marital status: Unknown    Spouse name: Not on file  . Number of children: Not on file  . Years of education: Not on file  . Highest education level: Not on file  Occupational History  . Not on file  Social Needs  . Financial resource strain: Not on file  . Food insecurity    Worry: Not on file    Inability: Not on file  . Transportation needs    Medical: Not on file  Non-medical: Not on file  Tobacco Use  . Smoking status: Former Smoker    Types: Cigarettes  . Smokeless tobacco: Never Used  Substance and Sexual Activity  . Alcohol use: Yes    Comment: social  . Drug use: No  . Sexual activity: Not on file  Lifestyle  . Physical activity    Days per week: Not on file    Minutes per session: Not on file  . Stress: Not on file  Relationships  . Social Herbalist on phone: Not on file    Gets together: Not on file    Attends religious service: Not on file    Active member of club or organization: Not on file    Attends meetings of clubs or organizations: Not on file     Relationship status: Not on file  . Intimate partner violence    Fear of current or ex partner: Not on file    Emotionally abused: Not on file    Physically abused: Not on file    Forced sexual activity: Not on file  Other Topics Concern  . Not on file  Social History Narrative  . Not on file      Review of Systems  Constitutional: Negative for chills, fatigue and unexpected weight change.  HENT: Negative for congestion, postnasal drip, rhinorrhea, sneezing and sore throat.   Respiratory: Negative for cough, chest tightness, shortness of breath and wheezing.   Cardiovascular: Negative for chest pain and palpitations.  Gastrointestinal: Negative for abdominal pain, constipation, diarrhea, nausea and vomiting.  Endocrine: Negative for cold intolerance, heat intolerance, polydipsia and polyuria.       Thyroid panel good.   Musculoskeletal: Negative for arthralgias, back pain, joint swelling and neck pain.  Skin: Negative for rash.  Allergic/Immunologic: Negative for environmental allergies.  Neurological: Negative for dizziness, tremors, numbness and headaches.  Hematological: Negative for adenopathy. Does not bruise/bleed easily.  Psychiatric/Behavioral: Positive for dysphoric mood. Negative for behavioral problems (Depression), sleep disturbance and suicidal ideas. The patient is nervous/anxious.    Today's Vitals   08/05/18 1159  BP: (!) 142/98  Pulse: 87  Weight: 230 lb (104.3 kg)  Height: 5\' 9"  (1.753 m)   Body mass index is 33.97 kg/m.  Observation/Objective:   The patient is alert and oriented. She is pleasant and answers all questions appropriately. Breathing is non-labored. She is in no acute distress at this time.    Assessment/Plan: 1. Hypothyroidism, unspecified type Thyroid panel stable. Continue levothyroxine as prescribed. Refills provided today.  - levothyroxine (SYNTHROID) 137 MCG tablet; Take 1 tablet (137 mcg total) by mouth daily before breakfast.   Dispense: 90 tablet; Refill: 2  2. Major depressive disorder, single episode, in remission ([place]) Continue wellbutrin as prescribed   3. Generalized anxiety disorder May use alprazolam 0.5mg  as needed and as prescribed for acute anxiety  General Counseling: Chelsea Sheppard verbalizes understanding of the findings of today's phone visit and agrees with plan of treatment. I have discussed any further diagnostic evaluation that may be needed or ordered today. We also reviewed her medications today. she has been encouraged to call the office with any questions or concerns that should arise related to todays visit.   This patient was seen by [place] with Dr Lavera Guise as a part of collaborative care agreement  Meds ordered this encounter  Medications  . levothyroxine (SYNTHROID) 137 MCG tablet    Sig: Take 1 tablet (137 mcg total) by mouth  daily before breakfast.    Dispense:  90 tablet    Refill:  2    This is new, increased dose    Order Specific Question:   Supervising Provider    Answer:   Lavera Guise [[id number]]    Time spent: 53 Minutes    Dr Lavera Guise Internal medicine

## 2018-09-10 ENCOUNTER — Encounter: Payer: Self-pay | Admitting: Nurse Practitioner

## 2018-09-10 LAB — COLOGUARD: Cologuard: NEGATIVE

## 2018-10-13 ENCOUNTER — Other Ambulatory Visit: Payer: Self-pay

## 2018-10-13 DIAGNOSIS — F325 Major depressive disorder, single episode, in full remission: Secondary | ICD-10-CM

## 2018-10-13 MED ORDER — BUPROPION HCL 75 MG PO TABS: ORAL_TABLET | ORAL | 1 refills | Status: DC

## 2019-02-03 ENCOUNTER — Telehealth: Payer: Self-pay

## 2019-02-03 NOTE — Telephone Encounter (Signed)
Confirmed 02-05-19 ov as virtual.

## 2019-02-05 ENCOUNTER — Ambulatory Visit: Payer: 59 | Admitting: Nurse Practitioner

## 2019-02-05 ENCOUNTER — Encounter: Payer: Self-pay | Admitting: Nurse Practitioner

## 2019-02-05 ENCOUNTER — Other Ambulatory Visit: Payer: Self-pay

## 2019-02-05 VITALS — BP 117/84 | HR 98 | Ht 69.0 in

## 2019-02-05 DIAGNOSIS — E039 Hypothyroidism, unspecified: Secondary | ICD-10-CM | POA: Diagnosis not present

## 2019-02-05 DIAGNOSIS — M170 Bilateral primary osteoarthritis of knee: Secondary | ICD-10-CM

## 2019-02-05 DIAGNOSIS — F411 Generalized anxiety disorder: Secondary | ICD-10-CM | POA: Diagnosis not present

## 2019-02-05 NOTE — Progress Notes (Signed)
[facility] [place] [place], [place] [address]  Internal MEDICINE  Telephone Visit  Patient Name: Chelsea Sheppard  [id number]  [id number]  Date of Service: 02/05/2019  I connected with the patient at 9:57am by webcam and verified the patients identity using two identifiers.   I discussed the limitations, risks, security and privacy concerns of performing an evaluation and management service by webcam and the availability of in person appointments. I also discussed with the patient that there may be a patient responsible charge related to the service.  The patient expressed understanding and agrees to proceed.    Chief Complaint  Patient presents with  . Telephone Assessment  . Telephone Screen  . Hypothyroidism  . Knee Pain    has more aches and pains in knees believes its because she has been working from home and not exercising     The patient has been contacted via webcam for follow up visit due to concerns for spread of novel coronavirus. The patient presents for follow up visit. She states that her knees have been a bit achy and stiff. Believes that this is related to being more sedentary. She is using OTC voltaren gel which helps relieve the pain. She does not have any new concerns or complaints. Doing well with current medications. States she barely needs to use the bedtime dose of alprazolam. Will wait until her next visit to get new prescription.       Current Medication: Outpatient Encounter Medications as of 02/05/2019  Medication Sig  . albuterol (PROVENTIL HFA;VENTOLIN HFA) 108 (90 Base) MCG/ACT inhaler Inhale 2 puffs into the lungs every 4 (four) hours as needed for wheezing or shortness of breath.  . ALPRAZolam (XANAX) 0.5 MG tablet Take 1 tablet (0.5 mg total) by mouth at bedtime as needed for anxiety.  Marland Kitchen buPROPion (WELLBUTRIN) 75 MG tablet 2 tablets po QAM  . diclofenac sodium (VOLTAREN) 1 % GEL Apply 4 g topically 4 (four) times daily.  .  fluticasone (FLONASE) 50 MCG/ACT nasal spray Place 1 spray into both nostrils daily.  Marland Kitchen levothyroxine (SYNTHROID) 137 MCG tablet Take 1 tablet (137 mcg total) by mouth daily before breakfast.  . traMADol (ULTRAM) 50 MG tablet Take 1 tablet (50 mg total) by mouth every 12 (twelve) hours as needed.   No facility-administered encounter medications on file as of 02/05/2019.    Surgical History: History reviewed. No pertinent surgical history.  Medical History: Past Medical History:  Diagnosis Date  . Environmental allergies   . Hypothyroidism     Family History: Family History  Problem Relation Age of Onset  . Diabetes Mother   . Thyroid disease Mother   . Hypertension Mother   . Kidney cancer Mother        tumor on kidney and had kidney removed  . Hypertension Father   . Prostate cancer Father   . Breast cancer Maternal Aunt 42  . Breast cancer Cousin 53       pat cousin    Social History   Socioeconomic History  . Marital status: Unknown    Spouse name: Not on file  . Number of children: Not on file  . Years of education: Not on file  . Highest education level: Not on file  Occupational History  . Not on file  Tobacco Use  . Smoking status: Former Smoker    Types: Cigarettes  . Smokeless tobacco: Never Used  Substance and Sexual Activity  . Alcohol use: Yes  Comment: social  . Drug use: No  . Sexual activity: Not on file  Other Topics Concern  . Not on file  Social History Narrative  . Not on file   Social Determinants of Health   Financial Resource Strain:   . Difficulty of Paying Living Expenses: Not on file  Food Insecurity:   . Worried About Charity fundraiser in the Last Year: Not on file  . Ran Out of Food in the Last Year: Not on file  Transportation Needs:   . Lack of Transportation (Medical): Not on file  . Lack of Transportation (Non-Medical): Not on file  Physical Activity:   . Days of Exercise per Week: Not on file  . Minutes of Exercise  per Session: Not on file  Stress:   . Feeling of Stress : Not on file  Social Connections:   . Frequency of Communication with Friends and Family: Not on file  . Frequency of Social Gatherings with Friends and Family: Not on file  . Attends Religious Services: Not on file  . Active Member of Clubs or Organizations: Not on file  . Attends Archivist Meetings: Not on file  . Marital Status: Not on file  Intimate Partner Violence:   . Fear of Current or Ex-Partner: Not on file  . Emotionally Abused: Not on file  . Physically Abused: Not on file  . Sexually Abused: Not on file      Review of Systems  Constitutional: Negative for activity change, chills, fatigue and unexpected weight change.       More sedentary due to isolation/quarantine because of COVID 19  HENT: Negative for congestion, postnasal drip, rhinorrhea, sneezing and sore throat.   Respiratory: Negative for cough, chest tightness, shortness of breath and wheezing.   Cardiovascular: Negative for chest pain and palpitations.  Gastrointestinal: Negative for abdominal pain, constipation, diarrhea, nausea and vomiting.  Endocrine: Negative for cold intolerance, heat intolerance, polydipsia and polyuria.  Musculoskeletal: Positive for arthralgias. Negative for back pain, joint swelling and neck pain.       Bilateral knee pain/stiffness  Skin: Negative for rash.  Neurological: Negative for dizziness, tremors, numbness and headaches.  Hematological: Negative for adenopathy. Does not bruise/bleed easily.  Psychiatric/Behavioral: Negative for behavioral problems (Depression), sleep disturbance and suicidal ideas. The patient is nervous/anxious.        Well managed with current medication    Today's Vitals   02/05/19 0923  BP: 117/84  Pulse: 98  Height: 5\' 9"  (1.753 m)   Body mass index is 33.97 kg/m.  Observation/Objective:   The patient is alert and oriented. She is pleasant and answers all questions  appropriately. Breathing is non-labored. She is in no acute distress at this time.    Assessment/Plan: 1. Acquired hypothyroidism Check thyroid panel and adjust levothyroxine as indicated.  2. Generalized anxiety disorder Continue bupropion as prescribed. May take alprazolam as needed and as prescribed   3. Osteoarthritis of both knees, unspecified osteoarthritis type Continue to use OTC voltaren gel as needed and as indicated.   General Counseling: bettejane sahli understanding of the findings of today's phone visit and agrees with plan of treatment. I have discussed any further diagnostic evaluation that may be needed or ordered today. We also reviewed her medications today. she has been encouraged to call the office with any questions or concerns that should arise related to todays visit.  This patient was seen by Leretha Pol FNP Collaboration with Dr Lavera Guise as  a part of collaborative care agreement   Time spent: 55 Minutes    Dr Lavera Guise Internal medicine

## 2019-03-06 ENCOUNTER — Other Ambulatory Visit: Payer: Self-pay | Admitting: Nurse Practitioner

## 2019-03-07 LAB — COMPREHENSIVE METABOLIC PANEL
ALT: 17 IU/L (ref 0–32)
AST: 17 IU/L (ref 0–40)
Albumin/Globulin Ratio: 2 (ref 1.2–2.2)
Albumin: 4.3 g/dL (ref 3.8–4.9)
Alkaline Phosphatase: 61 IU/L (ref 39–117)
BUN/Creatinine Ratio: 10 (ref 9–23)
BUN: 8 mg/dL (ref 6–24)
Bilirubin Total: 0.5 mg/dL (ref 0.0–1.2)
CO2: 24 mmol/L (ref 20–29)
Calcium: 9.6 mg/dL (ref 8.7–10.2)
Chloride: 100 mmol/L (ref 96–106)
Creatinine, Ser: 0.79 mg/dL (ref 0.57–1.00)
GFR calc Af Amer: 99 mL/min/{1.73_m2} (ref >59)
GFR calc non Af Amer: 86 mL/min/{1.73_m2} (ref >59)
Globulin, Total: 2.2 g/dL (ref 1.5–4.5)
Glucose: 97 mg/dL (ref 65–99)
Potassium: 4.7 mmol/L (ref 3.5–5.2)
Sodium: 140 mmol/L (ref 134–144)
Total Protein: 6.5 g/dL (ref 6.0–8.5)

## 2019-03-07 LAB — LIPID PANEL W/O CHOL/HDL RATIO
Cholesterol, Total: 212 mg/dL — ABNORMAL HIGH (ref 100–199)
HDL: 94 mg/dL (ref >39)
LDL Chol Calc (NIH): 105 mg/dL — ABNORMAL HIGH (ref 0–99)
Triglycerides: 76 mg/dL (ref 0–149)
VLDL Cholesterol Cal: 13 mg/dL (ref 5–40)

## 2019-03-07 LAB — CBC
Hematocrit: 45.9 % (ref 34.0–46.6)
Hemoglobin: 15.3 g/dL (ref 11.1–15.9)
MCH: 32.1 pg (ref 26.6–33.0)
MCHC: 33.3 g/dL (ref 31.5–35.7)
MCV: 96 fL (ref 79–97)
Platelets: 261 10*3/uL (ref 150–450)
RBC: 4.77 x10E6/uL (ref 3.77–5.28)
RDW: 11.6 % — ABNORMAL LOW (ref 11.7–15.4)
WBC: 9.1 10*3/uL (ref 3.4–10.8)

## 2019-03-07 LAB — T4, FREE: Free T4: 1.41 ng/dL (ref 0.82–1.77)

## 2019-03-07 LAB — TSH: TSH: 4.41 u[IU]/mL (ref 0.450–4.500)

## 2019-03-07 LAB — VITAMIN D 25 HYDROXY (VIT D DEFICIENCY, FRACTURES): Vit D, 25-Hydroxy: 33 ng/mL (ref 30.0–100.0)

## 2019-03-10 NOTE — Progress Notes (Signed)
Overall, good. Discuss at visit 03/12/2019

## 2019-03-11 ENCOUNTER — Ambulatory Visit (INDEPENDENT_AMBULATORY_CARE_PROVIDER_SITE_OTHER): Payer: 59 | Admitting: Nurse Practitioner

## 2019-03-11 ENCOUNTER — Other Ambulatory Visit: Payer: Self-pay

## 2019-03-11 ENCOUNTER — Encounter: Payer: Self-pay | Admitting: Nurse Practitioner

## 2019-03-11 VITALS — BP 133/90 | HR 96 | Temp 97.9°F | Resp 16 | Ht 69.0 in | Wt 228.0 lb

## 2019-03-11 DIAGNOSIS — F325 Major depressive disorder, single episode, in full remission: Secondary | ICD-10-CM

## 2019-03-11 DIAGNOSIS — Z0001 Encounter for general adult medical examination with abnormal findings: Secondary | ICD-10-CM | POA: Diagnosis not present

## 2019-03-11 DIAGNOSIS — F411 Generalized anxiety disorder: Secondary | ICD-10-CM

## 2019-03-11 DIAGNOSIS — E039 Hypothyroidism, unspecified: Secondary | ICD-10-CM | POA: Diagnosis not present

## 2019-03-11 DIAGNOSIS — Z1231 Encounter for screening mammogram for malignant neoplasm of breast: Secondary | ICD-10-CM

## 2019-03-11 DIAGNOSIS — R300 Dysuria: Secondary | ICD-10-CM

## 2019-03-11 MED ORDER — ALPRAZOLAM 0.5 MG PO TABS: 0.5000 mg | ORAL_TABLET | Freq: Every evening | ORAL | 1 refills | Status: DC | PRN

## 2019-03-11 MED ORDER — LEVOTHYROXINE SODIUM 137 MCG PO TABS: 137.0000 ug | ORAL_TABLET | Freq: Every day | ORAL | 3 refills | Status: DC

## 2019-03-11 MED ORDER — BUPROPION HCL 75 MG PO TABS: ORAL_TABLET | ORAL | 1 refills | Status: DC

## 2019-03-11 NOTE — Progress Notes (Signed)
[facility] [place], [place] [address]  Internal MEDICINE  Office Visit Note  Patient Name: Chelsea Sheppard  [id number]  [id number]  Date of Service: 03/11/2019  Chief Complaint  Patient presents with  . Annual Exam  . Hypothyroidism  . Anxiety     The patient presents for health maintenance exam. She is still treated for mild major depression and anxiety. Doing very well with current dose of wellbutrin. Takes alprazolam at bedtime as needed for anxiety which can cause her insomnia. The last prescription for this was given 01/2018. She does need to have a refill for this and her other routine medications. She did have routine, fasting labs prior to this visit. She has mild elevation of LDL and total cholesterol. Her other labs were essentially normal. She is due to have screening mammogram.   Pt is here for routine health maintenance examination  Current Medication: Outpatient Encounter Medications as of 03/11/2019  Medication Sig  . albuterol (PROVENTIL HFA;VENTOLIN HFA) 108 (90 Base) MCG/ACT inhaler Inhale 2 puffs into the lungs every 4 (four) hours as needed for wheezing or shortness of breath.  . ALPRAZolam (XANAX) 0.5 MG tablet Take 1 tablet (0.5 mg total) by mouth at bedtime as needed for anxiety.  Marland Kitchen buPROPion (WELLBUTRIN) 75 MG tablet 2 tablets po QAM  . diclofenac sodium (VOLTAREN) 1 % GEL Apply 4 g topically 4 (four) times daily.  . fluticasone (FLONASE) 50 MCG/ACT nasal spray Place 1 spray into both nostrils daily.  Marland Kitchen levothyroxine (SYNTHROID) 137 MCG tablet Take 1 tablet (137 mcg total) by mouth daily before breakfast.  . traMADol (ULTRAM) 50 MG tablet Take 1 tablet (50 mg total) by mouth every 12 (twelve) hours as needed.  . [DISCONTINUED] ALPRAZolam (XANAX) 0.5 MG tablet Take 1 tablet (0.5 mg total) by mouth at bedtime as needed for anxiety.  . [DISCONTINUED] buPROPion (WELLBUTRIN) 75 MG tablet 2 tablets po QAM  . [DISCONTINUED] levothyroxine  (SYNTHROID) 137 MCG tablet Take 1 tablet (137 mcg total) by mouth daily before breakfast.   No facility-administered encounter medications on file as of 03/11/2019.    Surgical History: History reviewed. No pertinent surgical history.  Medical History: Past Medical History:  Diagnosis Date  . Anxiety   . Environmental allergies   . Hypothyroidism     Family History: Family History  Problem Relation Age of Onset  . Diabetes Mother   . Thyroid disease Mother   . Hypertension Mother   . Kidney cancer Mother        tumor on kidney and had kidney removed  . Hypertension Father   . Prostate cancer Father   . Breast cancer Maternal Aunt 61  . Breast cancer Cousin 45       pat cousin      Review of Systems  Constitutional: Negative for activity change, chills, fatigue and unexpected weight change.  HENT: Negative for congestion, postnasal drip, rhinorrhea, sneezing and sore throat.   Respiratory: Negative for cough, chest tightness, shortness of breath and wheezing.   Cardiovascular: Negative for chest pain and palpitations.  Gastrointestinal: Negative for abdominal pain, constipation, diarrhea, nausea and vomiting.  Endocrine: Negative for cold intolerance, heat intolerance, polydipsia and polyuria.       Recent thyroid panel normal.   Genitourinary: Negative for dysuria, frequency and urgency.  Musculoskeletal: Negative for arthralgias, back pain, joint swelling and neck pain.  Skin: Negative for rash.  Allergic/Immunologic: Negative for environmental allergies.  Neurological: Negative for dizziness, tremors,  numbness and headaches.  Hematological: Negative for adenopathy. Does not bruise/bleed easily.  Psychiatric/Behavioral: Positive for dysphoric mood. Negative for behavioral problems (Depression), sleep disturbance and suicidal ideas. The patient is nervous/anxious.        Well managed with current medication.     Today's Vitals   03/11/19 0915  BP: 133/90  Pulse:  96  Resp: 16  Temp: 97.9 F (36.6 C)  SpO2: 94%  Weight: 228 lb (103.4 kg)  Height: 5\' 9"  (1.753 m)   Body mass index is 33.67 kg/m.  Physical Exam Vitals and nursing note reviewed.  Constitutional:      General: She is not in acute distress.    Appearance: Normal appearance. She is well-developed. She is not diaphoretic.  HENT:     Head: Normocephalic and atraumatic.     Nose: Nose normal.     Mouth/Throat:     Pharynx: No oropharyngeal exudate.  Eyes:     Pupils: Pupils are equal, round, and reactive to light.  Neck:     Thyroid: No thyromegaly.     Vascular: No JVD.     Trachea: No tracheal deviation.  Cardiovascular:     Rate and Rhythm: Normal rate and regular rhythm.     Pulses: Normal pulses.     Heart sounds: Normal heart sounds. No murmur. No friction rub. No gallop.   Pulmonary:     Effort: Pulmonary effort is normal. No respiratory distress.     Breath sounds: Normal breath sounds. No wheezing or rales.  Chest:     Chest wall: No tenderness.     Breasts:        Right: Normal. No swelling, bleeding, inverted nipple, mass, nipple discharge, skin change or tenderness.        Left: Normal. No swelling, bleeding, inverted nipple, mass, nipple discharge, skin change or tenderness.  Abdominal:     General: Bowel sounds are normal.     Palpations: Abdomen is soft.     Tenderness: There is no abdominal tenderness.  Musculoskeletal:        General: Normal range of motion.     Cervical back: Normal range of motion and neck supple.  Lymphadenopathy:     Cervical: No cervical adenopathy.     Upper Body:     Right upper body: No axillary adenopathy.     Left upper body: No axillary adenopathy.  Skin:    General: Skin is warm and dry.  Neurological:     Mental Status: She is alert and oriented to person, place, and time.     Cranial Nerves: No cranial nerve deficit.  Psychiatric:        Mood and Affect: Mood normal.        Behavior: Behavior normal.         Thought Content: Thought content normal.        Judgment: Judgment normal.      LABS: Recent Results (from the past 2160 hour(s))  Comprehensive metabolic panel     Status: None   Collection Time: 03/06/19  8:28 AM  Result Value Ref Range   Glucose 97 65 - 99 mg/dL   BUN 8 6 - 24 mg/dL   Creatinine, Ser 0.79 0.57 - 1.00 mg/dL   GFR calc non Af Amer 86 >59 mL/min/1.73   GFR calc Af Amer 99 >59 mL/min/1.73   BUN/Creatinine Ratio 10 9 - 23   Sodium 140 134 - 144 mmol/L   Potassium 4.7 3.5 - 5.2  mmol/L   Chloride 100 96 - 106 mmol/L   CO2 24 20 - 29 mmol/L   Calcium 9.6 8.7 - 10.2 mg/dL   Total Protein 6.5 6.0 - 8.5 g/dL   Albumin 4.3 3.8 - 4.9 g/dL   Globulin, Total 2.2 1.5 - 4.5 g/dL   Albumin/Globulin Ratio 2.0 1.2 - 2.2   Bilirubin Total 0.5 0.0 - 1.2 mg/dL   Alkaline Phosphatase 61 39 - 117 IU/L   AST 17 0 - 40 IU/L   ALT 17 0 - 32 IU/L  CBC     Status: Abnormal   Collection Time: 03/06/19  8:28 AM  Result Value Ref Range   WBC 9.1 3.4 - 10.8 x10E3/uL   RBC 4.77 3.77 - 5.28 x10E6/uL   Hemoglobin 15.3 11.1 - 15.9 g/dL   Hematocrit 45.9 34.0 - 46.6 %   MCV 96 79 - 97 fL   MCH 32.1 26.6 - 33.0 pg   MCHC 33.3 31.5 - 35.7 g/dL   RDW 11.6 (L) 11.7 - 15.4 %   Platelets 261 150 - 450 x10E3/uL  Lipid Panel w/o Chol/HDL Ratio     Status: Abnormal   Collection Time: 03/06/19  8:28 AM  Result Value Ref Range   Cholesterol, Total 212 (H) 100 - 199 mg/dL   Triglycerides 76 0 - 149 mg/dL   HDL 94 >39 mg/dL   VLDL Cholesterol Cal 13 5 - 40 mg/dL   LDL Chol Calc (NIH) 105 (H) 0 - 99 mg/dL  T4, free     Status: None   Collection Time: 03/06/19  8:28 AM  Result Value Ref Range   Free T4 1.41 0.82 - 1.77 ng/dL  TSH     Status: None   Collection Time: 03/06/19  8:28 AM  Result Value Ref Range   TSH 4.410 0.450 - 4.500 uIU/mL  VITAMIN D 25 Hydroxy (Vit-D Deficiency, Fractures)     Status: None   Collection Time: 03/06/19  8:28 AM  Result Value Ref Range   Vit D, 25-Hydroxy  33.0 30.0 - 100.0 ng/mL    Comment: Vitamin D deficiency has been defined by the [place] and an Endocrine Society practice guideline as a level of serum 25-OH vitamin D less than 20 ng/mL (1,2). The Endocrine Society went on to further define vitamin D insufficiency as a level between 21 and 29 ng/mL (2). 1. IOM (Institute of Medicine). 2010. Dietary reference    intakes for calcium and D. [place]: The    [employer]. 2. Holick MF, Binkley Slater, Bischoff-Ferrari HA, et al.    Evaluation, treatment, and prevention of vitamin D    deficiency: an Endocrine Society clinical practice    guideline. JCEM. 2011 Jul; 96(7):1911-30.      Assessment/Plan: 1. Encounter for general adult medical examination with abnormal findings Annual health maintenance exam today.   2. Hypothyroidism, unspecified type Thyroid panel normal. Continue levothyroxine at 133mcg daily.  - levothyroxine (SYNTHROID) 137 MCG tablet; Take 1 tablet (137 mcg total) by mouth daily before breakfast.  Dispense: 90 tablet; Refill: 3  3. Major depressive disorder, single episode, in remission ([place]) Stable. Continue wellbutrin at current dose  - buPROPion (WELLBUTRIN) 75 MG tablet; 2 tablets po QAM  Dispense: 180 tablet; Refill: 1  4. Generalized anxiety disorder May take alprazolam 0.5mg  tablets at bedtime as needed for anxiety/insomnia.  - ALPRAZolam (XANAX) 0.5 MG tablet; Take 1 tablet (0.5 mg total) by mouth at bedtime as needed for anxiety.  Dispense: 90 tablet; Refill: 1  5. Encounter for screening mammogram for malignant neoplasm of breast - MM DIGITAL SCREENING BILATERAL; Future  6. Dysuria - UA/M w/rflx Culture, Routine  General Counseling: Chelsea Sheppard verbalizes understanding of the findings of todays visit and agrees with plan of treatment. I have discussed any further diagnostic evaluation that may be needed or ordered today. We also reviewed her medications today. she has been  encouraged to call the office with any questions or concerns that should arise related to todays visit.    Counseling:  This patient was seen by Leretha Pol FNP Collaboration with Dr Lavera Guise as a part of collaborative care agreement  Orders Placed This Encounter  Procedures  . MM DIGITAL SCREENING BILATERAL  . UA/M w/rflx Culture, Routine    Meds ordered this encounter  Medications  . ALPRAZolam (XANAX) 0.5 MG tablet    Sig: Take 1 tablet (0.5 mg total) by mouth at bedtime as needed for anxiety.    Dispense:  90 tablet    Refill:  1    This is for 90 day prescription    Order Specific Question:   Supervising Provider    Answer:   Lavera Guise [place]  . buPROPion (WELLBUTRIN) 75 MG tablet    Sig: 2 tablets po QAM    Dispense:  180 tablet    Refill:  1    Order Specific Question:   Supervising Provider    Answer:   Lavera Guise [place]  . levothyroxine (SYNTHROID) 137 MCG tablet    Sig: Take 1 tablet (137 mcg total) by mouth daily before breakfast.    Dispense:  90 tablet    Refill:  3    Order Specific Question:   Supervising Provider    Answer:   Lavera Guise [id number]    Total time spent: 30 Minutes  Time spent includes review of chart, medications, test results, and follow up plan with the patient.     Lavera Guise, MD  Internal Medicine

## 2019-03-12 ENCOUNTER — Encounter: Payer: 59 | Admitting: Nurse Practitioner

## 2019-03-12 LAB — UA/M W/RFLX CULTURE, ROUTINE
Bilirubin, UA: NEGATIVE
Glucose, UA: NEGATIVE
Ketones, UA: NEGATIVE
Leukocytes,UA: NEGATIVE
Nitrite, UA: NEGATIVE
Protein,UA: NEGATIVE
RBC, UA: NEGATIVE
Specific Gravity, UA: 1.022 (ref 1.005–1.030)
Urobilinogen, Ur: 0.2 mg/dL (ref 0.2–1.0)
pH, UA: 6.5 (ref 5.0–7.5)

## 2019-03-12 LAB — MICROSCOPIC EXAMINATION: Casts: NONE SEEN /lpf

## 2019-04-30 ENCOUNTER — Ambulatory Visit: Payer: 59 | Attending: Internal Medicine

## 2019-04-30 DIAGNOSIS — Z23 Encounter for immunization: Secondary | ICD-10-CM

## 2019-04-30 NOTE — Progress Notes (Signed)
   Covid-19 Vaccination Clinic  Name:  Chelsea Sheppard    MRN: [id number] DOB: 1965/08/11  04/30/2019  Chelsea Sheppard was observed post Covid-19 immunization for 15 minutes without incident. She was provided with Vaccine Information Sheet and instruction to access the V-Safe system.   Chelsea Sheppard was instructed to call 911 with any severe reactions post vaccine: Marland Kitchen Difficulty breathing  . Swelling of face and throat  . A fast heartbeat  . A bad rash all over body  . Dizziness and weakness   Immunizations Administered    Name Date Dose VIS Date Route   Pfizer COVID-19 Vaccine 04/30/2019  8:56 AM 0.3 mL 01/02/2019 Intramuscular   Manufacturer: [place]   Lot: [id number]   [place]: [id number]

## 2019-05-26 ENCOUNTER — Ambulatory Visit: Payer: 59 | Attending: Internal Medicine

## 2019-05-26 DIAGNOSIS — Z23 Encounter for immunization: Secondary | ICD-10-CM

## 2019-05-26 NOTE — Progress Notes (Signed)
   Covid-19 Vaccination Clinic  Name:  NAOME MCBURNIE    MRN: [id number] DOB: [birth year]  05/26/2019  Ms. Couser was observed post Covid-19 immunization for 15 minutes without incident. She was provided with Vaccine Information Sheet and instruction to access the V-Safe system.   Ms. Musick was instructed to call 911 with any severe reactions post vaccine: Marland Kitchen Difficulty breathing  . Swelling of face and throat  . A fast heartbeat  . A bad rash all over body  . Dizziness and weakness   Immunizations Administered    Name Date Dose VIS Date Route   Pfizer COVID-19 Vaccine 05/26/2019  9:15 AM 0.3 mL 03/18/2018 Intramuscular   Manufacturer: [place]   Lot: [id number]   [place]: [id number]

## 2019-06-29 ENCOUNTER — Ambulatory Visit: Payer: 59

## 2019-07-13 ENCOUNTER — Other Ambulatory Visit: Payer: Self-pay

## 2019-07-13 ENCOUNTER — Ambulatory Visit
Admission: RE | Admit: 2019-07-13 | Discharge: 2019-07-13 | Disposition: A | Discharge status: Home / Self Care | Payer: 59 | Source: Ambulatory Visit | Attending: Nurse Practitioner | Admitting: Nurse Practitioner

## 2019-07-13 DIAGNOSIS — Z1231 Encounter for screening mammogram for malignant neoplasm of breast: Secondary | ICD-10-CM | POA: Insufficient documentation

## 2019-07-13 NOTE — Progress Notes (Signed)
Negative mammogram

## 2019-09-04 ENCOUNTER — Telehealth: Payer: Self-pay

## 2019-09-04 NOTE — Telephone Encounter (Signed)
Confirmed and screened for 09-08-19 ov. 

## 2019-09-08 ENCOUNTER — Other Ambulatory Visit: Payer: Self-pay

## 2019-09-08 ENCOUNTER — Encounter: Payer: Self-pay | Admitting: Nurse Practitioner

## 2019-09-08 ENCOUNTER — Ambulatory Visit: Payer: BC Managed Care – PPO | Admitting: Nurse Practitioner

## 2019-09-08 VITALS — BP 132/90 | HR 82 | Temp 97.1°F | Resp 16 | Ht 69.0 in | Wt 223.6 lb

## 2019-09-08 DIAGNOSIS — E039 Hypothyroidism, unspecified: Secondary | ICD-10-CM | POA: Diagnosis not present

## 2019-09-08 DIAGNOSIS — F325 Major depressive disorder, single episode, in full remission: Secondary | ICD-10-CM | POA: Diagnosis not present

## 2019-09-08 DIAGNOSIS — F411 Generalized anxiety disorder: Secondary | ICD-10-CM | POA: Diagnosis not present

## 2019-09-08 NOTE — Progress Notes (Signed)
[facility] [place], [place] [address]  Internal MEDICINE  Office Visit Note  Patient Name: Chelsea Sheppard  [id number]  [id number]  Date of Service: 09/08/2019  Chief Complaint  Patient presents with  . Follow-up    6 month f/u  . Quality Metric Gaps    Tdap    The patient is here for for routine follow up. She has had a lot of family related stress. Her gather passed away in Jun 12, 2022. She has been travelling back and forth to [place] to help her mom. Trying to get her mom to move closer.   She does take wellbutrin 75mg  daily. Will take alprazolam at bedtime when needed to help her sleep. She states that she does not need to have this refilled.  Blood pressure is mildly elevated. She did have a mild migraine headache yesterday, but also on menstrual cycle.       Current Medication: Outpatient Encounter Medications as of 09/08/2019  Medication Sig  . albuterol (PROVENTIL HFA;VENTOLIN HFA) 108 (90 Base) MCG/ACT inhaler Inhale 2 puffs into the lungs every 4 (four) hours as needed for wheezing or shortness of breath.  . ALPRAZolam (XANAX) 0.5 MG tablet Take 1 tablet (0.5 mg total) by mouth at bedtime as needed for anxiety.  Marland Kitchen buPROPion (WELLBUTRIN) 75 MG tablet 2 tablets po QAM  . diclofenac sodium (VOLTAREN) 1 % GEL Apply 4 g topically 4 (four) times daily.  . fluticasone (FLONASE) 50 MCG/ACT nasal spray Place 1 spray into both nostrils daily.  Marland Kitchen levothyroxine (SYNTHROID) 137 MCG tablet Take 1 tablet (137 mcg total) by mouth daily before breakfast.  . traMADol (ULTRAM) 50 MG tablet Take 1 tablet (50 mg total) by mouth every 12 (twelve) hours as needed.   No facility-administered encounter medications on file as of 09/08/2019.    Surgical History: History reviewed. No pertinent surgical history.  Medical History: Past Medical History:  Diagnosis Date  . Anxiety   . Environmental allergies   . Hypothyroidism     Family History: Family History   Problem Relation Age of Onset  . Diabetes Mother   . Thyroid disease Mother   . Hypertension Mother   . Kidney cancer Mother        tumor on kidney and had kidney removed  . Hypertension Father   . Prostate cancer Father   . Breast cancer Maternal Aunt 13  . Breast cancer Cousin 40       pat cousin  . Breast cancer Maternal Grandmother        mat great gm    Social History   Socioeconomic History  . Marital status: Married    Spouse name: Not on file  . Number of children: Not on file  . Years of education: Not on file  . Highest education level: Not on file  Occupational History  . Not on file  Tobacco Use  . Smoking status: Former Smoker    Types: Cigarettes  . Smokeless tobacco: Never Used  Substance and Sexual Activity  . Alcohol use: Yes    Comment: social  . Drug use: No  . Sexual activity: Not on file  Other Topics Concern  . Not on file  Social History Narrative  . Not on file   Social Determinants of Health   Financial Resource Strain:   . Difficulty of Paying Living Expenses:   Food Insecurity:   . Worried About Charity fundraiser in the Last Year:   .  Ran Out of Food in the Last Year:   Transportation Needs:   . Film/video editor (Medical):   Marland Kitchen Lack of Transportation (Non-Medical):   Physical Activity:   . Days of Exercise per Week:   . Minutes of Exercise per Session:   Stress:   . Feeling of Stress :   Social Connections:   . Frequency of Communication with Friends and Family:   . Frequency of Social Gatherings with Friends and Family:   . Attends Religious Services:   . Active Member of Clubs or Organizations:   . Attends Archivist Meetings:   Marland Kitchen Marital Status:   Intimate Partner Violence:   . Fear of Current or Ex-Partner:   . Emotionally Abused:   Marland Kitchen Physically Abused:   . Sexually Abused:       Review of Systems  Constitutional: Positive for fatigue. Negative for activity change, chills and unexpected weight  change.  HENT: Negative for congestion, postnasal drip, rhinorrhea, sneezing and sore throat.   Respiratory: Negative for cough, chest tightness, shortness of breath and wheezing.   Cardiovascular: Negative for chest pain and palpitations.  Gastrointestinal: Negative for abdominal pain, constipation, diarrhea, nausea and vomiting.  Endocrine: Negative for cold intolerance, heat intolerance, polydipsia and polyuria.       Stable thyroid panel   Musculoskeletal: Negative for arthralgias, back pain, joint swelling and neck pain.  Skin: Negative for rash.  Allergic/Immunologic: Negative for environmental allergies.  Neurological: Negative for dizziness, tremors, numbness and headaches.  Hematological: Negative for adenopathy. Does not bruise/bleed easily.  Psychiatric/Behavioral: Positive for dysphoric mood. Negative for behavioral problems (Depression), sleep disturbance and suicidal ideas. The patient is nervous/anxious.        Increased anxiety and family related stress.    Today's Vitals   09/08/19 0856  BP: 132/90  Pulse: 82  Resp: 16  Temp: (!) 97.1 F (36.2 C)  SpO2: 96%  Weight: 223 lb 9.6 oz (101.4 kg)  Height: 5\' 9"  (1.753 m)   Body mass index is 33.02 kg/m.  Physical Exam Vitals and nursing note reviewed.  Constitutional:      General: She is not in acute distress.    Appearance: Normal appearance. She is well-developed. She is not diaphoretic.  HENT:     Head: Normocephalic and atraumatic.     Nose: Nose normal.     Mouth/Throat:     Pharynx: No oropharyngeal exudate.  Eyes:     Pupils: Pupils are equal, round, and reactive to light.  Neck:     Thyroid: No thyromegaly.     Vascular: No carotid bruit or JVD.     Trachea: No tracheal deviation.  Cardiovascular:     Rate and Rhythm: Normal rate and regular rhythm.     Heart sounds: Normal heart sounds. No murmur heard.  No friction rub. No gallop.   Pulmonary:     Effort: Pulmonary effort is normal. No  respiratory distress.     Breath sounds: Normal breath sounds. No wheezing or rales.  Chest:     Chest wall: No tenderness.  Abdominal:     Palpations: Abdomen is soft.  Musculoskeletal:        General: Normal range of motion.     Cervical back: Normal range of motion and neck supple.  Lymphadenopathy:     Cervical: No cervical adenopathy.  Skin:    General: Skin is warm and dry.  Neurological:     Mental Status: She is alert and oriented  to person, place, and time.     Cranial Nerves: No cranial nerve deficit.  Psychiatric:        Attention and Perception: Attention and perception normal.        Mood and Affect: Mood is depressed.        Speech: Speech normal.        Behavior: Behavior normal. Behavior is cooperative.        Thought Content: Thought content normal.        Cognition and Memory: Cognition and memory normal.        Judgment: Judgment normal.    Assessment/Plan: 1. Acquired hypothyroidism Stable. Continue levothyroxine as prescribed   2. Major depressive disorder, single episode, in remission ([place]) Overall, well managed with current medication. Continue wellbutrin as prescribed   3. Generalized anxiety disorder May take alprazolam at bedtime as needed. Will send new prescription when needed.   General Counseling: hebah bogosian understanding of the findings of todays visit and agrees with plan of treatment. I have discussed any further diagnostic evaluation that may be needed or ordered today. We also reviewed her medications today. she has been encouraged to call the office with any questions or concerns that should arise related to todays visit.   This patient was seen by Leretha Pol FNP Collaboration with Dr Lavera Guise as a part of collaborative care agreement  Total time spent: 25 Minutes   Time spent includes review of chart, medications, test results, and follow up plan with the patient.      Dr Lavera Guise Internal medicine

## 2019-09-15 ENCOUNTER — Other Ambulatory Visit: Payer: Self-pay

## 2019-09-15 DIAGNOSIS — F325 Major depressive disorder, single episode, in full remission: Secondary | ICD-10-CM

## 2019-09-15 MED ORDER — BUPROPION HCL 75 MG PO TABS: ORAL_TABLET | ORAL | 1 refills | Status: DC

## 2020-03-14 ENCOUNTER — Other Ambulatory Visit: Payer: Self-pay | Admitting: Nurse Practitioner

## 2020-03-14 ENCOUNTER — Encounter: Payer: BC Managed Care – PPO | Admitting: Hospice and Palliative Medicine

## 2020-03-14 DIAGNOSIS — F325 Major depressive disorder, single episode, in full remission: Secondary | ICD-10-CM

## 2020-03-24 ENCOUNTER — Other Ambulatory Visit: Payer: Self-pay | Admitting: Nurse Practitioner

## 2020-03-24 DIAGNOSIS — E039 Hypothyroidism, unspecified: Secondary | ICD-10-CM | POA: Diagnosis not present

## 2020-03-24 DIAGNOSIS — I10 Essential (primary) hypertension: Secondary | ICD-10-CM | POA: Diagnosis not present

## 2020-03-24 DIAGNOSIS — E559 Vitamin D deficiency, unspecified: Secondary | ICD-10-CM | POA: Diagnosis not present

## 2020-03-24 DIAGNOSIS — Z0001 Encounter for general adult medical examination with abnormal findings: Secondary | ICD-10-CM | POA: Diagnosis not present

## 2020-03-25 LAB — LIPID PANEL WITH LDL/HDL RATIO
Cholesterol, Total: 228 mg/dL — ABNORMAL HIGH (ref 100–199)
HDL: 76 mg/dL (ref >39)
LDL Chol Calc (NIH): 137 mg/dL — ABNORMAL HIGH (ref 0–99)
LDL/HDL Ratio: 1.8 ratio (ref 0.0–3.2)
Triglycerides: 86 mg/dL (ref 0–149)
VLDL Cholesterol Cal: 15 mg/dL (ref 5–40)

## 2020-03-25 LAB — CBC
Hematocrit: 45.1 % (ref 34.0–46.6)
Hemoglobin: 14.8 g/dL (ref 11.1–15.9)
MCH: 31 pg (ref 26.6–33.0)
MCHC: 32.8 g/dL (ref 31.5–35.7)
MCV: 94 fL (ref 79–97)
Platelets: 214 10*3/uL (ref 150–450)
RBC: 4.78 x10E6/uL (ref 3.77–5.28)
RDW: 11.6 % — ABNORMAL LOW (ref 11.7–15.4)
WBC: 7.6 10*3/uL (ref 3.4–10.8)

## 2020-03-25 LAB — COMPREHENSIVE METABOLIC PANEL
ALT: 19 IU/L (ref 0–32)
AST: 19 IU/L (ref 0–40)
Albumin/Globulin Ratio: 2 (ref 1.2–2.2)
Albumin: 4.3 g/dL (ref 3.8–4.9)
Alkaline Phosphatase: 56 IU/L (ref 44–121)
BUN/Creatinine Ratio: 10 (ref 9–23)
BUN: 7 mg/dL (ref 6–24)
Bilirubin Total: 0.6 mg/dL (ref 0.0–1.2)
CO2: 22 mmol/L (ref 20–29)
Calcium: 9.1 mg/dL (ref 8.7–10.2)
Chloride: 101 mmol/L (ref 96–106)
Creatinine, Ser: 0.71 mg/dL (ref 0.57–1.00)
Globulin, Total: 2.1 g/dL (ref 1.5–4.5)
Glucose: 76 mg/dL (ref 65–99)
Potassium: 4.4 mmol/L (ref 3.5–5.2)
Sodium: 141 mmol/L (ref 134–144)
Total Protein: 6.4 g/dL (ref 6.0–8.5)
eGFR: 101 mL/min/{1.73_m2} (ref >59)

## 2020-03-25 LAB — VITAMIN D 25 HYDROXY (VIT D DEFICIENCY, FRACTURES): Vit D, 25-Hydroxy: 32.6 ng/mL (ref 30.0–100.0)

## 2020-03-25 LAB — T4, FREE: Free T4: 1.68 ng/dL (ref 0.82–1.77)

## 2020-03-25 LAB — TSH: TSH: 3.2 u[IU]/mL (ref 0.450–4.500)

## 2020-03-31 ENCOUNTER — Encounter: Payer: BC Managed Care – PPO | Admitting: Physician Assistant

## 2020-04-18 ENCOUNTER — Ambulatory Visit (INDEPENDENT_AMBULATORY_CARE_PROVIDER_SITE_OTHER): Payer: BC Managed Care – PPO | Admitting: Physician Assistant

## 2020-04-18 ENCOUNTER — Encounter: Payer: Self-pay | Admitting: Physician Assistant

## 2020-04-18 DIAGNOSIS — Z1231 Encounter for screening mammogram for malignant neoplasm of breast: Secondary | ICD-10-CM

## 2020-04-18 DIAGNOSIS — E039 Hypothyroidism, unspecified: Secondary | ICD-10-CM

## 2020-04-18 DIAGNOSIS — F411 Generalized anxiety disorder: Secondary | ICD-10-CM | POA: Diagnosis not present

## 2020-04-18 DIAGNOSIS — Z124 Encounter for screening for malignant neoplasm of cervix: Secondary | ICD-10-CM

## 2020-04-18 DIAGNOSIS — R300 Dysuria: Secondary | ICD-10-CM | POA: Diagnosis not present

## 2020-04-18 DIAGNOSIS — E7800 Pure hypercholesterolemia, unspecified: Secondary | ICD-10-CM

## 2020-04-18 DIAGNOSIS — F325 Major depressive disorder, single episode, in full remission: Secondary | ICD-10-CM | POA: Diagnosis not present

## 2020-04-18 DIAGNOSIS — E6609 Other obesity due to excess calories: Secondary | ICD-10-CM

## 2020-04-18 DIAGNOSIS — Z6833 Body mass index (BMI) 33.0-33.9, adult: Secondary | ICD-10-CM

## 2020-04-18 DIAGNOSIS — Z0001 Encounter for general adult medical examination with abnormal findings: Secondary | ICD-10-CM

## 2020-04-18 MED ORDER — LEVOTHYROXINE SODIUM 137 MCG PO TABS: 137.0000 ug | ORAL_TABLET | Freq: Every day | ORAL | 3 refills | Status: DC

## 2020-04-18 MED ORDER — ATORVASTATIN CALCIUM 10 MG PO TABS: 10.0000 mg | ORAL_TABLET | Freq: Every day | ORAL | 1 refills | Status: DC

## 2020-04-18 MED ORDER — ALPRAZOLAM 0.5 MG PO TABS: ORAL_TABLET | ORAL | 0 refills | Status: DC

## 2020-04-18 NOTE — Progress Notes (Signed)
[facility] [place], [place] [address]  Internal MEDICINE  Office Visit Note  Patient Name: Chelsea Sheppard  [id number]  [id number]  Date of Service: 04/20/2020  Chief Complaint  Patient presents with  . Annual Exam  . Anxiety  . Hypothyroidism     HPI Pt is here for routine health maintenance examination -She will be due for her mammogram in June, she did cologuard in 2020 will repeat in 2023 -Reviewed labs, she takes vit D supplement and is low normal now, will continue to supplement.   -High cholesterol found on labs and is elevated since last check--discussed we will start statin now -L hip aches at times, but usually not too bothersome -Husband has prostate cancer and just had surgery. Husband's sister also passed away recently. So there have been a lot of life stressors but states she is managing. She does well on wellbutrin and is taking 1/2 tab xanax more frequently since this happened. Now uses most days to help her sleep. -Does mention she still has menstrual cycle--now shorter duration, but more intense--heavy bleeding 1.5-2 days and then tapers off. Still regular every month. Will monitor. -BP elevated initially, but improved on recheck--continue to monitor  Current Medication: Outpatient Encounter Medications as of 04/18/2020  Medication Sig  . albuterol (PROVENTIL HFA;VENTOLIN HFA) 108 (90 Base) MCG/ACT inhaler Inhale 2 puffs into the lungs every 4 (four) hours as needed for wheezing or shortness of breath.  Marland Kitchen atorvastatin (LIPITOR) 10 MG tablet Take 1 tablet (10 mg total) by mouth daily.  Marland Kitchen buPROPion (WELLBUTRIN) 75 MG tablet TAKE 2 TABLETS EVERY MORNING for depression  . diclofenac sodium (VOLTAREN) 1 % GEL Apply 4 g topically 4 (four) times daily.  . fluticasone (FLONASE) 50 MCG/ACT nasal spray Place 1 spray into both nostrils daily.  . [DISCONTINUED] ALPRAZolam (XANAX) 0.5 MG tablet Take 1 tablet (0.5 mg total) by mouth at bedtime as  needed for anxiety.  . [DISCONTINUED] levothyroxine (SYNTHROID) 137 MCG tablet Take 1 tablet (137 mcg total) by mouth daily before breakfast.  . [DISCONTINUED] traMADol (ULTRAM) 50 MG tablet Take 1 tablet (50 mg total) by mouth every 12 (twelve) hours as needed.  . ALPRAZolam (XANAX) 0.5 MG tablet Take 0.5-1 tablet by mouth at bedtime as needed for anxiety  . levothyroxine (SYNTHROID) 137 MCG tablet Take 1 tablet (137 mcg total) by mouth daily before breakfast.   No facility-administered encounter medications on file as of 04/18/2020.    Surgical History: History reviewed. No pertinent surgical history.  Medical History: Past Medical History:  Diagnosis Date  . Anxiety   . Environmental allergies   . Hypothyroidism     Family History: Family History  Problem Relation Age of Onset  . Diabetes Mother   . Thyroid disease Mother   . Hypertension Mother   . Kidney cancer Mother        tumor on kidney and had kidney removed  . Hypertension Father   . Prostate cancer Father   . Breast cancer Maternal Aunt 30  . Breast cancer Cousin 40       pat cousin  . Breast cancer Maternal Grandmother        mat great gm      Review of Systems  Constitutional: Negative for chills, fatigue and unexpected weight change.  HENT: Negative for congestion, postnasal drip, rhinorrhea, sneezing and sore throat.   Eyes: Negative for redness.  Respiratory: Negative for cough, chest tightness and shortness of breath.  Cardiovascular: Negative for chest pain and palpitations.  Gastrointestinal: Negative for abdominal pain, constipation, diarrhea, nausea and vomiting.  Genitourinary: Negative for dysuria, frequency and pelvic pain.  Musculoskeletal: Positive for arthralgias. Negative for back pain, joint swelling and neck pain.  Skin: Negative for rash.  Neurological: Negative.  Negative for tremors and numbness.  Hematological: Negative for adenopathy. Does not bruise/bleed easily.   Psychiatric/Behavioral: Positive for sleep disturbance. Negative for behavioral problems (Depression) and suicidal ideas. The patient is nervous/anxious.      Vital Signs: BP (!) 142/90   Pulse 96   Temp 98.3 F (36.8 C)   Resp 16   Ht _0  (1.753 m)   Wt 229 lb 12.8 oz (104.2 kg)   SpO2 97%   BMI 33.94 kg/m    Physical Exam Vitals and nursing note reviewed.  Constitutional:      General: She is not in acute distress.    Appearance: She is well-developed. She is obese. She is not diaphoretic.  HENT:     Head: Normocephalic and atraumatic.     Right Ear: External ear normal.     Left Ear: External ear normal.     Nose: Nose normal.     Mouth/Throat:     Pharynx: No oropharyngeal exudate.  Eyes:     General: No scleral icterus.       Right eye: No discharge.        Left eye: No discharge.     Conjunctiva/sclera: Conjunctivae normal.     Pupils: Pupils are equal, round, and reactive to light.  Neck:     Thyroid: No thyromegaly.     Vascular: No JVD.     Trachea: No tracheal deviation.  Cardiovascular:     Rate and Rhythm: Normal rate and regular rhythm.     Heart sounds: Normal heart sounds. No murmur heard. No friction rub. No gallop.   Pulmonary:     Effort: Pulmonary effort is normal. No respiratory distress.     Breath sounds: Normal breath sounds. No stridor. No wheezing or rales.  Chest:     Chest wall: No tenderness.  Breasts:     Right: Normal. No mass.     Left: Normal. No mass.    Abdominal:     General: Bowel sounds are normal. There is no distension.     Palpations: Abdomen is soft. There is no mass.     Tenderness: There is no abdominal tenderness. There is no guarding or rebound.  Musculoskeletal:        General: No tenderness or deformity. Normal range of motion.     Cervical back: Normal range of motion and neck supple.  Lymphadenopathy:     Cervical: No cervical adenopathy.  Skin:    General: Skin is warm and dry.     Coloration: Skin  is not pale.     Findings: No erythema or rash.  Neurological:     Mental Status: She is alert.     Cranial Nerves: No cranial nerve deficit.     Motor: No abnormal muscle tone.     Coordination: Coordination normal.     Deep Tendon Reflexes: Reflexes are normal and symmetric.  Psychiatric:        Behavior: Behavior normal.        Thought Content: Thought content normal.        Judgment: Judgment normal.      LABS: Recent Results (from the past 2160 hour(s))  Comprehensive metabolic panel  Status: None   Collection Time: 03/24/20  8:35 AM  Result Value Ref Range   Glucose 76 65 - 99 mg/dL   BUN 7 6 - 24 mg/dL   Creatinine, Ser 0.71 0.57 - 1.00 mg/dL   eGFR 101 >59 mL/min/1.73    Comment: **In accordance with recommendations from the NKF-ASN Task force,**   [facility] has updated its eGFR calculation to the 2021 CKD-EPI   creatinine equation that estimates kidney function without a race   variable.    BUN/Creatinine Ratio 10 9 - 23   Sodium 141 134 - 144 mmol/L   Potassium 4.4 3.5 - 5.2 mmol/L   Chloride 101 96 - 106 mmol/L   CO2 22 20 - 29 mmol/L   Calcium 9.1 8.7 - 10.2 mg/dL   Total Protein 6.4 6.0 - 8.5 g/dL   Albumin 4.3 3.8 - 4.9 g/dL   Globulin, Total 2.1 1.5 - 4.5 g/dL   Albumin/Globulin Ratio 2.0 1.2 - 2.2   Bilirubin Total 0.6 0.0 - 1.2 mg/dL   Alkaline Phosphatase 56 44 - 121 IU/L   AST 19 0 - 40 IU/L   ALT 19 0 - 32 IU/L  CBC     Status: Abnormal   Collection Time: 03/24/20  8:35 AM  Result Value Ref Range   WBC 7.6 3.4 - 10.8 x10E3/uL   RBC 4.78 3.77 - 5.28 x10E6/uL   Hemoglobin 14.8 11.1 - 15.9 g/dL   Hematocrit 45.1 34.0 - 46.6 %   MCV 94 79 - 97 fL   MCH 31.0 26.6 - 33.0 pg   MCHC 32.8 31.5 - 35.7 g/dL   RDW 11.6 (L) 11.7 - 15.4 %   Platelets 214 150 - 450 x10E3/uL  Lipid Panel With LDL/HDL Ratio     Status: Abnormal   Collection Time: 03/24/20  8:35 AM  Result Value Ref Range   Cholesterol, Total 228 (H) 100 - 199 mg/dL   Triglycerides 86 0  - 149 mg/dL   HDL 76 >39 mg/dL   VLDL Cholesterol Cal 15 5 - 40 mg/dL   LDL Chol Calc (NIH) 137 (H) 0 - 99 mg/dL   LDL/HDL Ratio 1.8 0.0 - 3.2 ratio    Comment:                                     LDL/HDL Ratio                                             Men  Women                               1/2 Avg.Risk  1.0    1.5                                   Avg.Risk  3.6    3.2                                2X Avg.Risk  6.2    5.0  3X Avg.Risk  8.0    6.1   T4, free     Status: None   Collection Time: 03/24/20  8:35 AM  Result Value Ref Range   Free T4 1.68 0.82 - 1.77 ng/dL  TSH     Status: None   Collection Time: 03/24/20  8:35 AM  Result Value Ref Range   TSH 3.200 0.450 - 4.500 uIU/mL  VITAMIN D 25 Hydroxy (Vit-D Deficiency, Fractures)     Status: None   Collection Time: 03/24/20  8:35 AM  Result Value Ref Range   Vit D, 25-Hydroxy 32.6 30.0 - 100.0 ng/mL    Comment: Vitamin D deficiency has been defined by the [place] and an Endocrine Society practice guideline as a level of serum 25-OH vitamin D less than 20 ng/mL (1,2). The Endocrine Society went on to further define vitamin D insufficiency as a level between 21 and 29 ng/mL (2). 1. IOM (Institute of Medicine). 2010. Dietary reference    intakes for calcium and D. [place]: The    [employer]. 2. Holick MF, Binkley Pomeroy, Bischoff-Ferrari HA, et al.    Evaluation, treatment, and prevention of vitamin D    deficiency: an Endocrine Society clinical practice    guideline. JCEM. 2011 Jul; 96(7):1911-30.   UA/M w/rflx Culture, Routine     Status: Abnormal (Preliminary result)   Collection Time: 04/18/20  4:23 PM   Specimen: Urine   Urine  Result Value Ref Range   Specific Gravity, UA 1.008 1.005 - 1.030   pH, UA 6.5 5.0 - 7.5   Color, UA Yellow Yellow   Appearance Ur Clear Clear   Leukocytes,UA Trace (A) Negative   Protein,UA Negative Negative/Trace   Glucose, UA  Negative Negative   Ketones, UA Negative Negative   RBC, UA Negative Negative   Bilirubin, UA Negative Negative   Urobilinogen, Ur 0.2 0.2 - 1.0 mg/dL   Nitrite, UA Negative Negative   Microscopic Examination See below:     Comment: Microscopic was indicated and was performed.   Urinalysis Reflex Comment     Comment: This specimen has reflexed to a Urine Culture.  Microscopic Examination     Status: None   Collection Time: 04/18/20  4:23 PM   Urine  Result Value Ref Range   WBC, UA 0-5 0 - 5 /hpf   RBC None seen 0 - 2 /hpf   Epithelial Cells (non renal) 0-10 0 - 10 /hpf   Casts None seen None seen /lpf   Bacteria, UA None seen None seen/Few  Urine Culture, Reflex     Status: None (Preliminary result)   Collection Time: 04/18/20  4:23 PM   Urine  Result Value Ref Range   Urine Culture, Routine WILL FOLLOW         Assessment/Plan: 1. Encounter for general adult medical examination with abnormal findings Reviewed recent lab work, pt will be due for mammogram in June, cologuard repeat in 2023  2. Major depressive disorder, single episode, in remission ([place]) Stable, continue wellbutrin.  3. Generalized anxiety disorder Increased stress/anxiety since husband's surgery and sister-in laws passing. Now taking xanax almost daily. Mya continue to take 1/2 tab at night for anxiety. - ALPRAZolam (XANAX) 0.5 MG tablet; Take 0.5-1 tablet by mouth at bedtime as needed for anxiety  Dispense: 90 tablet; Refill: 0  4. Pure hypercholesterolemia Rising on recent labs, will start statin now and continue to monitor. - atorvastatin (LIPITOR) 10 MG tablet; Take 1 tablet (  10 mg total) by mouth daily.  Dispense: 90 tablet; Refill: 1  5. Hypothyroidism, unspecified type Stable, continue synthroid--refill sent - levothyroxine (SYNTHROID) 137 MCG tablet; Take 1 tablet (137 mcg total) by mouth daily before breakfast.  Dispense: 90 tablet; Refill: 3  6. Encounter for screening mammogram for  malignant neoplasm of breast - MM 3D SCREEN BREAST BILATERAL; Future  7. Class 1 obesity due to excess calories without serious comorbidity with body mass index (BMI) 33.0-33.9 in adult Obesity Counseling: Had a lengthy discussion regarding patients BMI and weight issues. Patient was instructed on portion control as well as increased activity. Also discussed caloric restrictions with trying to maintain intake less than 2000 Kcal. Discussions were made in accordance with the 5As of weight management. Simple actions such as not eating late and if able to, taking a walk is suggested.  8. Dysuria - UA/M w/rflx Culture, Routine    General Counseling: Brendia verbalizes understanding of the findings of todays visit and agrees with plan of treatment. I have discussed any further diagnostic evaluation that may be needed or ordered today. We also reviewed her medications today. she has been encouraged to call the office with any questions or concerns that should arise related to todays visit.    Counseling:    Orders Placed This Encounter  Procedures  . Microscopic Examination  . Urine Culture, Reflex  . MM 3D SCREEN BREAST BILATERAL  . UA/M w/rflx Culture, Routine    Meds ordered this encounter  Medications  . levothyroxine (SYNTHROID) 137 MCG tablet    Sig: Take 1 tablet (137 mcg total) by mouth daily before breakfast.    Dispense:  90 tablet    Refill:  3  . ALPRAZolam (XANAX) 0.5 MG tablet    Sig: Take 0.5-1 tablet by mouth at bedtime as needed for anxiety    Dispense:  90 tablet    Refill:  0    This is for 90 day prescription  . atorvastatin (LIPITOR) 10 MG tablet    Sig: Take 1 tablet (10 mg total) by mouth daily.    Dispense:  90 tablet    Refill:  1    This patient was seen by Drema Dallas, PA-C in collaboration with Dr. Clayborn Bigness as a part of collaborative care agreement.  Total time spent:30 Minutes  Time spent includes review of chart, medications, test results,  and follow up plan with the patient.     Lavera Guise, MD  Internal Medicine

## 2020-04-19 LAB — UA/M W/RFLX CULTURE, ROUTINE: Protein,UA: NEGATIVE

## 2020-04-21 LAB — UA/M W/RFLX CULTURE, ROUTINE
Bilirubin, UA: NEGATIVE
Glucose, UA: NEGATIVE
Ketones, UA: NEGATIVE
Nitrite, UA: NEGATIVE
RBC, UA: NEGATIVE
Specific Gravity, UA: 1.008 (ref 1.005–1.030)
Urobilinogen, Ur: 0.2 mg/dL (ref 0.2–1.0)
pH, UA: 6.5 (ref 5.0–7.5)

## 2020-04-21 LAB — MICROSCOPIC EXAMINATION
Bacteria, UA: NONE SEEN
Casts: NONE SEEN /lpf
RBC, Urine: NONE SEEN /hpf (ref 0–2)

## 2020-04-21 LAB — URINE CULTURE, REFLEX

## 2020-07-05 NOTE — Progress Notes (Signed)
Discuss labs, abnormal lipid profile

## 2020-07-21 ENCOUNTER — Other Ambulatory Visit: Payer: Self-pay

## 2020-07-21 ENCOUNTER — Ambulatory Visit: Payer: BC Managed Care – PPO | Admitting: Physician Assistant

## 2020-07-21 ENCOUNTER — Encounter: Payer: Self-pay | Admitting: Physician Assistant

## 2020-07-21 DIAGNOSIS — E7800 Pure hypercholesterolemia, unspecified: Secondary | ICD-10-CM | POA: Diagnosis not present

## 2020-07-21 DIAGNOSIS — F325 Major depressive disorder, single episode, in full remission: Secondary | ICD-10-CM | POA: Diagnosis not present

## 2020-07-21 DIAGNOSIS — F411 Generalized anxiety disorder: Secondary | ICD-10-CM

## 2020-07-21 DIAGNOSIS — E039 Hypothyroidism, unspecified: Secondary | ICD-10-CM | POA: Diagnosis not present

## 2020-07-21 NOTE — Progress Notes (Signed)
[facility] [place], [place] [address]  Internal MEDICINE  Office Visit Note  Patient Name: Chelsea Sheppard  [id number]  [id number]  Date of Service: 07/21/2020  Chief Complaint  Patient presents with   Follow-up   Anxiety   Quality Metric Gaps    Covid booster, shingrix    HPI Pt is here for routine follow up -Started melatonin before bed to avoid xanax as much as possible. Only uses a 1/2 tab as needed which has not been often recently. -Leaving tomorrow to go to [place] to move mom down here, so she is anxious about that and does report she may have to take xanax for that, but otherwise has been doing much better. She is unsure whether she even needs both tabs of wellbutrin and may try tapering down to 1 tab after this weekend. She is currently taking 150mg  total and will try just taking 75mg  and see how she feels. -Does not need any refills today  Current Medication: Outpatient Encounter Medications as of 07/21/2020  Medication Sig   ALPRAZolam (XANAX) 0.5 MG tablet Take 0.5-1 tablet by mouth at bedtime as needed for anxiety   atorvastatin (LIPITOR) 10 MG tablet Take 1 tablet (10 mg total) by mouth daily.   buPROPion (WELLBUTRIN) 75 MG tablet TAKE 2 TABLETS EVERY MORNING for depression   diclofenac sodium (VOLTAREN) 1 % GEL Apply 4 g topically 4 (four) times daily.   fluticasone (FLONASE) 50 MCG/ACT nasal spray Place 1 spray into both nostrils daily.   levothyroxine (SYNTHROID) 137 MCG tablet Take 1 tablet (137 mcg total) by mouth daily before breakfast.   [DISCONTINUED] albuterol (PROVENTIL HFA;VENTOLIN HFA) 108 (90 Base) MCG/ACT inhaler Inhale 2 puffs into the lungs every 4 (four) hours as needed for wheezing or shortness of breath. (Patient not taking: Reported on 07/21/2020)   No facility-administered encounter medications on file as of 07/21/2020.    Surgical History: History reviewed. No pertinent surgical history.  Medical History: Past Medical  History:  Diagnosis Date   Anxiety    Environmental allergies    Hypothyroidism     Family History: Family History  Problem Relation Age of Onset   Diabetes Mother    Thyroid disease Mother    Hypertension Mother    Kidney cancer Mother        tumor on kidney and had kidney removed   Hypertension Father    Prostate cancer Father    Breast cancer Maternal Aunt 81   Breast cancer Cousin 93       pat cousin   Breast cancer Maternal Grandmother        mat great gm    Social History   Socioeconomic History   Marital status: Married    Spouse name: Not on file   Number of children: Not on file   Years of education: Not on file   Highest education level: Not on file  Occupational History   Not on file  Tobacco Use   Smoking status: Former    Pack years: 0.00    Types: Cigarettes   Smokeless tobacco: Never  Substance and Sexual Activity   Alcohol use: Yes    Comment: social   Drug use: No   Sexual activity: Not on file  Other Topics Concern   Not on file  Social History Narrative   Not on file   Social Determinants of Health   Financial Resource Strain: Not on file  Food Insecurity: Not on file  Transportation Needs: Not on file  Physical Activity: Not on file  Stress: Not on file  Social Connections: Not on file  Intimate Partner Violence: Not on file      Review of Systems  Constitutional:  Negative for chills, fatigue and unexpected weight change.  HENT:  Negative for congestion, postnasal drip, rhinorrhea, sneezing and sore throat.   Eyes:  Negative for redness.  Respiratory:  Negative for cough, chest tightness and shortness of breath.   Cardiovascular:  Negative for chest pain and palpitations.  Gastrointestinal:  Negative for abdominal pain, constipation, diarrhea, nausea and vomiting.  Genitourinary:  Negative for dysuria and frequency.  Musculoskeletal:  Negative for arthralgias, back pain, joint swelling and neck pain.  Skin:  Negative for  rash.  Neurological: Negative.  Negative for tremors and numbness.  Hematological:  Negative for adenopathy. Does not bruise/bleed easily.  Psychiatric/Behavioral:  Positive for sleep disturbance. Negative for behavioral problems (Depression) and suicidal ideas. The patient is nervous/anxious.    Vital Signs: BP 132/90   Pulse 83   Temp (!) 97.2 F (36.2 C)   Resp 16   Ht 5\' 8"  (1.727 m)   Wt 233 lb (105.7 kg)   SpO2 98%   BMI 35.43 kg/m    Physical Exam Vitals and nursing note reviewed.  Constitutional:      General: She is not in acute distress.    Appearance: She is well-developed. She is obese. She is not diaphoretic.  HENT:     Head: Normocephalic and atraumatic.     Mouth/Throat:     Pharynx: No oropharyngeal exudate.  Eyes:     Pupils: Pupils are equal, round, and reactive to light.  Neck:     Thyroid: No thyromegaly.     Vascular: No JVD.     Trachea: No tracheal deviation.  Cardiovascular:     Rate and Rhythm: Normal rate and regular rhythm.     Heart sounds: Normal heart sounds. No murmur heard.   No friction rub. No gallop.  Pulmonary:     Effort: Pulmonary effort is normal. No respiratory distress.     Breath sounds: No wheezing or rales.  Chest:     Chest wall: No tenderness.  Abdominal:     General: Bowel sounds are normal.     Palpations: Abdomen is soft.  Musculoskeletal:        General: Normal range of motion.     Cervical back: Normal range of motion and neck supple.  Lymphadenopathy:     Cervical: No cervical adenopathy.  Skin:    General: Skin is warm and dry.  Neurological:     Mental Status: She is alert and oriented to person, place, and time.     Cranial Nerves: No cranial nerve deficit.  Psychiatric:        Behavior: Behavior normal.        Thought Content: Thought content normal.        Judgment: Judgment normal.       Assessment/Plan: 1. Generalized anxiety disorder Continue xanax only as needed  2. Major depressive  disorder, single episode, in remission [facility]) May taper down to 75mg  of wellbutrin if desired, otherwise may continue current dose of 150mg   3. Pure hypercholesterolemia Continue lipitor  4. Acquired hypothyroidism Continue levothyroxine   General Counseling: Chelsea Sheppard verbalizes understanding of the findings of todays visit and agrees with plan of treatment. I have discussed any further diagnostic evaluation that may be needed or ordered today. We also  reviewed her medications today. she has been encouraged to call the office with any questions or concerns that should arise related to todays visit.    No orders of the defined types were placed in this encounter.   No orders of the defined types were placed in this encounter.   This patient was seen by Drema Dallas, PA-C in collaboration with Dr. Clayborn Bigness as a part of collaborative care agreement.   Total time spent:30 Minutes Time spent includes review of chart, medications, test results, and follow up plan with the patient.      Dr Lavera Guise Internal medicine

## 2020-09-09 ENCOUNTER — Other Ambulatory Visit: Payer: Self-pay | Admitting: Internal Medicine

## 2020-09-09 DIAGNOSIS — F325 Major depressive disorder, single episode, in full remission: Secondary | ICD-10-CM

## 2020-09-22 ENCOUNTER — Other Ambulatory Visit: Payer: Self-pay | Admitting: Physician Assistant

## 2020-09-22 DIAGNOSIS — E7800 Pure hypercholesterolemia, unspecified: Secondary | ICD-10-CM

## 2020-09-22 DIAGNOSIS — E039 Hypothyroidism, unspecified: Secondary | ICD-10-CM

## 2020-09-22 DIAGNOSIS — F325 Major depressive disorder, single episode, in full remission: Secondary | ICD-10-CM

## 2020-09-22 DIAGNOSIS — F411 Generalized anxiety disorder: Secondary | ICD-10-CM

## 2020-09-22 DIAGNOSIS — Z0001 Encounter for general adult medical examination with abnormal findings: Secondary | ICD-10-CM

## 2020-09-22 DIAGNOSIS — Z1231 Encounter for screening mammogram for malignant neoplasm of breast: Secondary | ICD-10-CM

## 2020-09-22 DIAGNOSIS — E6609 Other obesity due to excess calories: Secondary | ICD-10-CM

## 2020-09-22 DIAGNOSIS — R300 Dysuria: Secondary | ICD-10-CM

## 2020-11-08 ENCOUNTER — Ambulatory Visit
Admission: RE | Admit: 2020-11-08 | Discharge: 2020-11-08 | Disposition: A | Discharge status: Home / Self Care | Payer: BC Managed Care – PPO | Source: Ambulatory Visit | Attending: Physician Assistant | Admitting: Physician Assistant

## 2020-11-08 ENCOUNTER — Other Ambulatory Visit: Payer: Self-pay

## 2020-11-08 DIAGNOSIS — Z0001 Encounter for general adult medical examination with abnormal findings: Secondary | ICD-10-CM | POA: Insufficient documentation

## 2020-11-08 DIAGNOSIS — R300 Dysuria: Secondary | ICD-10-CM | POA: Diagnosis not present

## 2020-11-08 DIAGNOSIS — F411 Generalized anxiety disorder: Secondary | ICD-10-CM | POA: Insufficient documentation

## 2020-11-08 DIAGNOSIS — F325 Major depressive disorder, single episode, in full remission: Secondary | ICD-10-CM | POA: Diagnosis not present

## 2020-11-08 DIAGNOSIS — E7800 Pure hypercholesterolemia, unspecified: Secondary | ICD-10-CM | POA: Insufficient documentation

## 2020-11-08 DIAGNOSIS — E6609 Other obesity due to excess calories: Secondary | ICD-10-CM | POA: Insufficient documentation

## 2020-11-08 DIAGNOSIS — Z1231 Encounter for screening mammogram for malignant neoplasm of breast: Secondary | ICD-10-CM | POA: Insufficient documentation

## 2020-11-08 DIAGNOSIS — E039 Hypothyroidism, unspecified: Secondary | ICD-10-CM | POA: Insufficient documentation

## 2020-11-08 DIAGNOSIS — Z6833 Body mass index (BMI) 33.0-33.9, adult: Secondary | ICD-10-CM | POA: Insufficient documentation

## 2020-11-14 ENCOUNTER — Other Ambulatory Visit: Payer: Self-pay | Admitting: Physician Assistant

## 2020-11-14 DIAGNOSIS — N6489 Other specified disorders of breast: Secondary | ICD-10-CM

## 2020-11-14 DIAGNOSIS — R928 Other abnormal and inconclusive findings on diagnostic imaging of breast: Secondary | ICD-10-CM

## 2020-11-18 ENCOUNTER — Ambulatory Visit: Payer: BC Managed Care – PPO | Admitting: Physician Assistant

## 2020-11-24 ENCOUNTER — Ambulatory Visit
Admission: RE | Admit: 2020-11-24 | Discharge: 2020-11-24 | Disposition: A | Discharge status: Home / Self Care | Payer: BC Managed Care – PPO | Source: Ambulatory Visit | Attending: Physician Assistant | Admitting: Physician Assistant

## 2020-11-24 ENCOUNTER — Other Ambulatory Visit: Payer: Self-pay

## 2020-11-24 DIAGNOSIS — N6489 Other specified disorders of breast: Secondary | ICD-10-CM | POA: Insufficient documentation

## 2020-11-24 DIAGNOSIS — R922 Inconclusive mammogram: Secondary | ICD-10-CM | POA: Diagnosis not present

## 2020-11-24 DIAGNOSIS — R928 Other abnormal and inconclusive findings on diagnostic imaging of breast: Secondary | ICD-10-CM | POA: Insufficient documentation

## 2020-11-25 ENCOUNTER — Ambulatory Visit: Payer: BC Managed Care – PPO | Admitting: Physician Assistant

## 2020-12-22 ENCOUNTER — Ambulatory Visit: Payer: BC Managed Care – PPO | Admitting: Physician Assistant

## 2021-04-12 ENCOUNTER — Other Ambulatory Visit: Payer: BC Managed Care – PPO | Admitting: Nurse Practitioner

## 2021-04-13 ENCOUNTER — Other Ambulatory Visit: Payer: Self-pay | Admitting: Physician Assistant

## 2021-04-13 DIAGNOSIS — R300 Dysuria: Secondary | ICD-10-CM

## 2021-04-13 DIAGNOSIS — F411 Generalized anxiety disorder: Secondary | ICD-10-CM

## 2021-04-13 DIAGNOSIS — E6609 Other obesity due to excess calories: Secondary | ICD-10-CM

## 2021-04-13 DIAGNOSIS — Z1231 Encounter for screening mammogram for malignant neoplasm of breast: Secondary | ICD-10-CM

## 2021-04-13 DIAGNOSIS — E039 Hypothyroidism, unspecified: Secondary | ICD-10-CM

## 2021-04-13 DIAGNOSIS — E7800 Pure hypercholesterolemia, unspecified: Secondary | ICD-10-CM

## 2021-04-13 DIAGNOSIS — Z0001 Encounter for general adult medical examination with abnormal findings: Secondary | ICD-10-CM

## 2021-04-13 DIAGNOSIS — F325 Major depressive disorder, single episode, in full remission: Secondary | ICD-10-CM

## 2021-04-19 ENCOUNTER — Other Ambulatory Visit: Payer: BC Managed Care – PPO | Admitting: Nurse Practitioner

## 2021-05-02 ENCOUNTER — Telehealth: Payer: Self-pay

## 2021-05-02 NOTE — Telephone Encounter (Signed)
Left vm and sent mychart message to confirm 05/09/21 appointment-Toni ?

## 2021-05-09 ENCOUNTER — Ambulatory Visit (INDEPENDENT_AMBULATORY_CARE_PROVIDER_SITE_OTHER): Payer: BC Managed Care – PPO | Admitting: Nurse Practitioner

## 2021-05-09 ENCOUNTER — Encounter: Payer: Self-pay | Admitting: Nurse Practitioner

## 2021-05-09 VITALS — BP 130/88 | HR 90 | Temp 98.5°F | Resp 16 | Ht 68.5 in | Wt 237.0 lb

## 2021-05-09 DIAGNOSIS — J3089 Other allergic rhinitis: Secondary | ICD-10-CM | POA: Diagnosis not present

## 2021-05-09 DIAGNOSIS — E559 Vitamin D deficiency, unspecified: Secondary | ICD-10-CM | POA: Diagnosis not present

## 2021-05-09 DIAGNOSIS — E782 Mixed hyperlipidemia: Secondary | ICD-10-CM | POA: Diagnosis not present

## 2021-05-09 DIAGNOSIS — E538 Deficiency of other specified B group vitamins: Secondary | ICD-10-CM

## 2021-05-09 DIAGNOSIS — R300 Dysuria: Secondary | ICD-10-CM | POA: Diagnosis not present

## 2021-05-09 DIAGNOSIS — E7800 Pure hypercholesterolemia, unspecified: Secondary | ICD-10-CM

## 2021-05-09 DIAGNOSIS — Z23 Encounter for immunization: Secondary | ICD-10-CM

## 2021-05-09 DIAGNOSIS — Z0001 Encounter for general adult medical examination with abnormal findings: Secondary | ICD-10-CM

## 2021-05-09 MED ORDER — ZOSTER VAC RECOMB ADJUVANTED 50 MCG/0.5ML IM SUSR: 0.5000 mL | Freq: Once | INTRAMUSCULAR | 0 refills | Status: AC

## 2021-05-09 MED ORDER — TETANUS-DIPHTH-ACELL PERTUSSIS 5-2.5-18.5 LF-MCG/0.5 IM SUSP: 0.5000 mL | Freq: Once | INTRAMUSCULAR | 0 refills | Status: AC

## 2021-05-09 NOTE — Progress Notes (Signed)
[facility] ?[address] ?[place], [place] [address] ? ?Internal MEDICINE  ?Office Visit Note ? ?Patient Name: Chelsea Sheppard ? [id number]  ?[id number] ? ?Date of Service: 05/09/2021 ? ?Chief Complaint  ?Patient presents with  ? Annual Exam  ? Anxiety  ? ? ?HPI ?[name] presents for an annual well visit and physical exam.  She is a well-appearing 56 year old female with hypothyroidism, allergic rhinitis, osteoarthritis, depression and anxiety.  She is due for her annual physical exam today, routine labs and a couple of recommended vaccinations which will be listed in the orders.  Her blood pressure and other vital signs are stable and within normal limits. ?Cologuard was negative in August 2020 so she will be due to repeat this test in August this year.  She had her most recent mammogram in October 2022 and additional imaging was needed and the result was BI-RADS Category 2 benign.  She will be due for mammogram again in October this year.  She is not due for Pap smear until January 2025.  ?She has no other questions or concerns.  She denies any new or worsening pain. ? ? ?Current Medication: ?Outpatient Encounter Medications as of 05/09/2021  ?Medication Sig  ? ALPRAZolam (XANAX) 0.5 MG tablet Take 0.5-1 tablet by mouth at bedtime as needed for anxiety  ? atorvastatin (LIPITOR) 10 MG tablet TAKE 1 TABLET DAILY  ? buPROPion (WELLBUTRIN) 75 MG tablet TAKE 2 TABLETS EVERY MORNING FOR DEPRESSION  ? diclofenac sodium (VOLTAREN) 1 % GEL Apply 4 g topically 4 (four) times daily.  ? fluticasone (FLONASE) 50 MCG/ACT nasal spray Place 1 spray into both nostrils daily.  ? levothyroxine (SYNTHROID) 137 MCG tablet TAKE 1 TABLET DAILY BEFORE BREAKFAST  ? [DISCONTINUED] Tdap (BOOSTRIX) 5-2.5-18.5 LF-MCG/0.5 injection Inject 0.5 mLs into the muscle once.  ? [DISCONTINUED] Zoster Vaccine Adjuvanted [facility]) injection Inject 0.5 mLs into the muscle once.  ? [EXPIRED] Tdap (BOOSTRIX) 5-2.5-18.5 LF-MCG/0.5 injection Inject  0.5 mLs into the muscle once for 1 dose.  ? [EXPIRED] Zoster Vaccine Adjuvanted [facility]) injection Inject 0.5 mLs into the muscle once for 1 dose.  ? ?No facility-administered encounter medications on file as of 05/09/2021.  ? ? ?Surgical History: ?History reviewed. No pertinent surgical history. ? ?Medical History: ?Past Medical History:  ?Diagnosis Date  ? Anxiety   ? Environmental allergies   ? Hypothyroidism   ? ? ?Family History: ?Family History  ?Problem Relation Age of Onset  ? Diabetes Mother   ? Thyroid disease Mother   ? Hypertension Mother   ? Kidney cancer Mother   ?     tumor on kidney and had kidney removed  ? Hypertension Father   ? Prostate cancer Father   ? Breast cancer Maternal Aunt 43  ? Breast cancer Maternal Grandmother   ?     mat great gm  ? Breast cancer Cousin 71  ?     pat cousin  ? ? ?Social History  ? ?Socioeconomic History  ? Marital status: Married  ?  Spouse name: Not on file  ? Number of children: Not on file  ? Years of education: Not on file  ? Highest education level: Not on file  ?Occupational History  ? Not on file  ?Tobacco Use  ? Smoking status: Former  ?  Types: Cigarettes  ? Smokeless tobacco: Never  ?Substance and Sexual Activity  ? Alcohol use: Yes  ?  Comment: social  ? Drug use: No  ? Sexual activity: Not on  file  ?Other Topics Concern  ? Not on file  ?Social History Narrative  ? Not on file  ? ?Social Determinants of Health  ? ?Financial Resource Strain: Not on file  ?Food Insecurity: Not on file  ?Transportation Needs: Not on file  ?Physical Activity: Not on file  ?Stress: Not on file  ?Social Connections: Not on file  ?Intimate Partner Violence: Not on file  ? ? ? ? ?Review of Systems  ?Constitutional:  Negative for activity change, appetite change, chills, fatigue, fever and unexpected weight change.  ?HENT: Negative.  Negative for congestion, ear pain, rhinorrhea, sore throat and trouble swallowing.   ?Eyes: Negative.   ?Respiratory: Negative.  Negative for cough,  chest tightness, shortness of breath and wheezing.   ?Cardiovascular: Negative.  Negative for chest pain.  ?Gastrointestinal: Negative.  Negative for abdominal pain, blood in stool, constipation, diarrhea, nausea and vomiting.  ?Endocrine: Negative.   ?Genitourinary: Negative.  Negative for difficulty urinating, dysuria, frequency, hematuria and urgency.  ?Musculoskeletal: Negative.  Negative for arthralgias, back pain, joint swelling, myalgias and neck pain.  ?Skin: Negative.  Negative for rash and wound.  ?Allergic/Immunologic: Negative.  Negative for immunocompromised state.  ?Neurological: Negative.  Negative for dizziness, seizures, numbness and headaches.  ?Hematological: Negative.   ?Psychiatric/Behavioral: Negative.  Negative for behavioral problems, self-injury and suicidal ideas. The patient is not nervous/anxious.   ? ?Vital Signs: ?BP 130/88   Pulse 90   Temp 98.5 ?F (36.9 ?C)   Resp 16   Ht 5' 8.5" (1.74 m)   Wt 237 lb (107.5 kg)   SpO2 97%   BMI 35.51 kg/m?  ? ? ?Physical Exam ?Vitals reviewed.  ?Constitutional:   ?   General: She is awake. She is not in acute distress. ?   Appearance: Normal appearance. She is well-developed and well-groomed. She is obese. She is not ill-appearing or diaphoretic.  ?HENT:  ?   Head: Normocephalic and atraumatic.  ?   Right Ear: Tympanic membrane, ear canal and external ear normal.  ?   Left Ear: Tympanic membrane, ear canal and external ear normal.  ?   Nose: Nose normal. No congestion or rhinorrhea.  ?   Mouth/Throat:  ?   Mouth: Mucous membranes are moist.  ?   Pharynx: Oropharynx is clear. Uvula midline. No oropharyngeal exudate or posterior oropharyngeal erythema.  ?Eyes:  ?   General: Lids are normal. Vision grossly intact. Gaze aligned appropriately. No scleral icterus.    ?   Right eye: No discharge.     ?   Left eye: No discharge.  ?   Extraocular Movements: Extraocular movements intact.  ?   Conjunctiva/sclera: Conjunctivae normal.  ?   Pupils: Pupils  are equal, round, and reactive to light.  ?   Funduscopic exam: ?   Right eye: Red reflex present.     ?   Left eye: Red reflex present. ?Neck:  ?   Thyroid: No thyromegaly.  ?   Vascular: No carotid bruit or JVD.  ?   Trachea: No tracheal deviation.  ?Cardiovascular:  ?   Rate and Rhythm: Normal rate and regular rhythm.  ?   Pulses: Normal pulses.  ?   Heart sounds: Normal heart sounds, S1 normal and S2 normal. No murmur heard. ?  No friction rub. No gallop.  ?Pulmonary:  ?   Effort: Pulmonary effort is normal. No accessory muscle usage or respiratory distress.  ?   Breath sounds: Normal breath sounds and air entry.  No stridor. No wheezing or rales.  ?Chest:  ?   Chest wall: No tenderness.  ?Breasts: ?   Right: Normal.  ?   Left: Normal.  ?Abdominal:  ?   General: Bowel sounds are normal. There is no distension.  ?   Palpations: Abdomen is soft. There is no shifting dullness, fluid wave, mass or pulsatile mass.  ?   Tenderness: There is no abdominal tenderness. There is no guarding or rebound.  ?Musculoskeletal:     ?   General: No tenderness or deformity. Normal range of motion.  ?   Cervical back: Normal range of motion and neck supple.  ?   Right lower leg: No edema.  ?   Left lower leg: No edema.  ?Lymphadenopathy:  ?   Cervical: No cervical adenopathy.  ?Skin: ?   General: Skin is warm and dry.  ?   Capillary Refill: Capillary refill takes less than 2 seconds.  ?   Coloration: Skin is not pale.  ?   Findings: No erythema or rash.  ?Neurological:  ?   Mental Status: She is alert and oriented to person, place, and time.  ?   Cranial Nerves: No cranial nerve deficit.  ?   Motor: No abnormal muscle tone.  ?   Coordination: Coordination normal.  ?   Deep Tendon Reflexes: Reflexes are normal and symmetric.  ?Psychiatric:     ?   Mood and Affect: Mood and affect normal.     ?   Behavior: Behavior normal. Behavior is cooperative.     ?   Thought Content: Thought content normal.     ?   Judgment: Judgment normal.   ? ? ? ? ? ?Assessment/Plan: ?1. Encounter for general adult medical examination with abnormal findings ?Age-appropriate preventive screenings and vaccinations discussed, annual physical exam completed. Routine labs

## 2021-05-12 LAB — UA/M W/RFLX CULTURE, ROUTINE
Bilirubin, UA: NEGATIVE
Glucose, UA: NEGATIVE
Ketones, UA: NEGATIVE
Nitrite, UA: NEGATIVE
Protein,UA: NEGATIVE
RBC, UA: NEGATIVE
Specific Gravity, UA: 1.009 (ref 1.005–1.030)
Urobilinogen, Ur: 0.2 mg/dL (ref 0.2–1.0)
pH, UA: 7 (ref 5.0–7.5)

## 2021-05-12 LAB — MICROSCOPIC EXAMINATION
Bacteria, UA: NONE SEEN
Casts: NONE SEEN /lpf
Epithelial Cells (non renal): NONE SEEN /hpf (ref 0–10)
WBC, UA: NONE SEEN /hpf (ref 0–5)

## 2021-05-12 LAB — URINE CULTURE, REFLEX

## 2021-05-21 ENCOUNTER — Encounter: Payer: Self-pay | Admitting: Nurse Practitioner

## 2021-09-04 ENCOUNTER — Other Ambulatory Visit: Payer: Self-pay | Admitting: Internal Medicine

## 2021-09-04 DIAGNOSIS — F325 Major depressive disorder, single episode, in full remission: Secondary | ICD-10-CM

## 2021-09-18 ENCOUNTER — Other Ambulatory Visit: Payer: Self-pay | Admitting: Physician Assistant

## 2021-09-18 DIAGNOSIS — Z1231 Encounter for screening mammogram for malignant neoplasm of breast: Secondary | ICD-10-CM

## 2021-09-18 DIAGNOSIS — R300 Dysuria: Secondary | ICD-10-CM

## 2021-09-18 DIAGNOSIS — E6609 Other obesity due to excess calories: Secondary | ICD-10-CM

## 2021-09-18 DIAGNOSIS — Z0001 Encounter for general adult medical examination with abnormal findings: Secondary | ICD-10-CM

## 2021-09-18 DIAGNOSIS — E7800 Pure hypercholesterolemia, unspecified: Secondary | ICD-10-CM

## 2021-09-18 DIAGNOSIS — F411 Generalized anxiety disorder: Secondary | ICD-10-CM

## 2021-09-18 DIAGNOSIS — E039 Hypothyroidism, unspecified: Secondary | ICD-10-CM

## 2021-09-18 DIAGNOSIS — F325 Major depressive disorder, single episode, in full remission: Secondary | ICD-10-CM

## 2021-11-06 ENCOUNTER — Ambulatory Visit: Payer: BC Managed Care – PPO | Admitting: Physician Assistant

## 2021-12-26 ENCOUNTER — Other Ambulatory Visit: Payer: Self-pay | Admitting: Internal Medicine

## 2021-12-26 DIAGNOSIS — Z1231 Encounter for screening mammogram for malignant neoplasm of breast: Secondary | ICD-10-CM

## 2022-01-03 ENCOUNTER — Ambulatory Visit: Payer: BC Managed Care – PPO

## 2022-01-04 ENCOUNTER — Ambulatory Visit
Admission: RE | Admit: 2022-01-04 | Discharge: 2022-01-04 | Disposition: A | Discharge status: Home / Self Care | Payer: BC Managed Care – PPO | Source: Ambulatory Visit | Attending: Internal Medicine | Admitting: Internal Medicine

## 2022-01-04 DIAGNOSIS — Z1231 Encounter for screening mammogram for malignant neoplasm of breast: Secondary | ICD-10-CM | POA: Insufficient documentation

## 2022-01-04 DIAGNOSIS — R300 Dysuria: Secondary | ICD-10-CM | POA: Diagnosis not present

## 2022-01-04 DIAGNOSIS — J3089 Other allergic rhinitis: Secondary | ICD-10-CM | POA: Diagnosis not present

## 2022-01-04 DIAGNOSIS — E782 Mixed hyperlipidemia: Secondary | ICD-10-CM | POA: Diagnosis not present

## 2022-01-04 DIAGNOSIS — E538 Deficiency of other specified B group vitamins: Secondary | ICD-10-CM | POA: Diagnosis not present

## 2022-01-04 DIAGNOSIS — Z0001 Encounter for general adult medical examination with abnormal findings: Secondary | ICD-10-CM | POA: Diagnosis not present

## 2022-01-04 DIAGNOSIS — E7800 Pure hypercholesterolemia, unspecified: Secondary | ICD-10-CM | POA: Diagnosis not present

## 2022-01-04 DIAGNOSIS — E559 Vitamin D deficiency, unspecified: Secondary | ICD-10-CM | POA: Diagnosis not present

## 2022-01-05 LAB — CMP14+EGFR
ALT: 18 IU/L (ref 0–32)
AST: 15 IU/L (ref 0–40)
Albumin/Globulin Ratio: 1.8 (ref 1.2–2.2)
Albumin: 4.2 g/dL (ref 3.8–4.9)
Alkaline Phosphatase: 65 IU/L (ref 44–121)
BUN/Creatinine Ratio: 14 (ref 9–23)
BUN: 10 mg/dL (ref 6–24)
Bilirubin Total: 0.7 mg/dL (ref 0.0–1.2)
CO2: 22 mmol/L (ref 20–29)
Calcium: 9.4 mg/dL (ref 8.7–10.2)
Chloride: 103 mmol/L (ref 96–106)
Creatinine, Ser: 0.73 mg/dL (ref 0.57–1.00)
Globulin, Total: 2.3 g/dL (ref 1.5–4.5)
Glucose: 101 mg/dL — ABNORMAL HIGH (ref 70–99)
Potassium: 4.9 mmol/L (ref 3.5–5.2)
Sodium: 141 mmol/L (ref 134–144)
Total Protein: 6.5 g/dL (ref 6.0–8.5)
eGFR: 96 mL/min/{1.73_m2} (ref >59)

## 2022-01-05 LAB — CBC WITH DIFFERENTIAL/PLATELET
Basophils Absolute: 0.1 10*3/uL (ref 0.0–0.2)
Basos: 1 %
EOS (ABSOLUTE): 0.3 10*3/uL (ref 0.0–0.4)
Eos: 4 %
Hematocrit: 47.2 % — ABNORMAL HIGH (ref 34.0–46.6)
Hemoglobin: 15.2 g/dL (ref 11.1–15.9)
Immature Grans (Abs): 0 10*3/uL (ref 0.0–0.1)
Immature Granulocytes: 0 %
Lymphocytes Absolute: 1.4 10*3/uL (ref 0.7–3.1)
Lymphs: 21 %
MCH: 31 pg (ref 26.6–33.0)
MCHC: 32.2 g/dL (ref 31.5–35.7)
MCV: 96 fL (ref 79–97)
Monocytes Absolute: 0.5 10*3/uL (ref 0.1–0.9)
Monocytes: 8 %
Neutrophils Absolute: 4.3 10*3/uL (ref 1.4–7.0)
Neutrophils: 66 %
Platelets: 243 10*3/uL (ref 150–450)
RBC: 4.91 x10E6/uL (ref 3.77–5.28)
RDW: 11.5 % — ABNORMAL LOW (ref 11.7–15.4)
WBC: 6.6 10*3/uL (ref 3.4–10.8)

## 2022-01-05 LAB — VITAMIN D 25 HYDROXY (VIT D DEFICIENCY, FRACTURES): Vit D, 25-Hydroxy: 29.2 ng/mL — ABNORMAL LOW (ref 30.0–100.0)

## 2022-01-05 LAB — LIPID PANEL
Chol/HDL Ratio: 2.1 ratio (ref 0.0–4.4)
Cholesterol, Total: 173 mg/dL (ref 100–199)
HDL: 83 mg/dL (ref >39)
LDL Chol Calc (NIH): 73 mg/dL (ref 0–99)
Triglycerides: 92 mg/dL (ref 0–149)
VLDL Cholesterol Cal: 17 mg/dL (ref 5–40)

## 2022-01-05 LAB — B12 AND FOLATE PANEL
Folate: 17.7 ng/mL (ref >3.0)
Vitamin B-12: 644 pg/mL (ref 232–1245)

## 2022-02-04 IMAGING — US US BREAST*R* LIMITED INC AXILLA
1 series · 6 of 6 positions shown · non-contrast
Comparison: Previous exam(s).

CLINICAL DATA: Screening recall for a possible right breast
asymmetry.

EXAM:
DIGITAL DIAGNOSTIC UNILATERAL RIGHT MAMMOGRAM WITH TOMOSYNTHESIS AND
CAD; ULTRASOUND RIGHT BREAST LIMITED
TECHNIQUE: Right digital diagnostic mammography and breast tomosynthesis was
performed. The images were evaluated with computer-aided detection.;
Targeted ultrasound examination of the right breast was performed

[Series 1: us breast*right* limited inc axilla · 0.03mm/px · 6 of 6 slices shown]
[im 1/6]
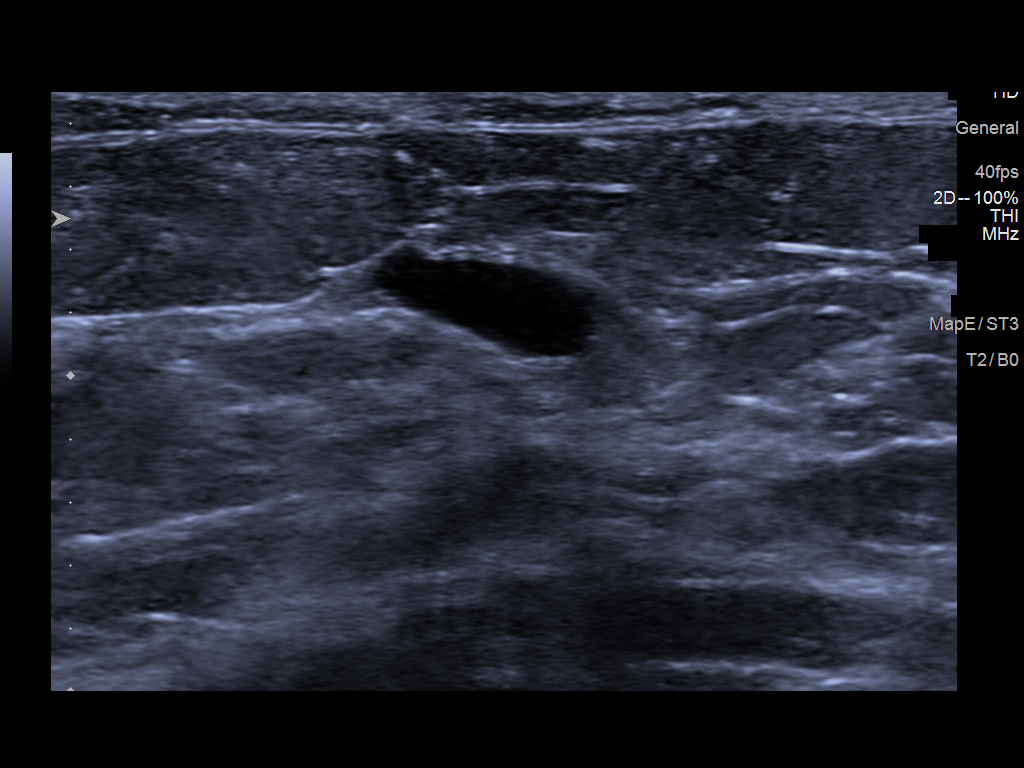
[im 2/6]
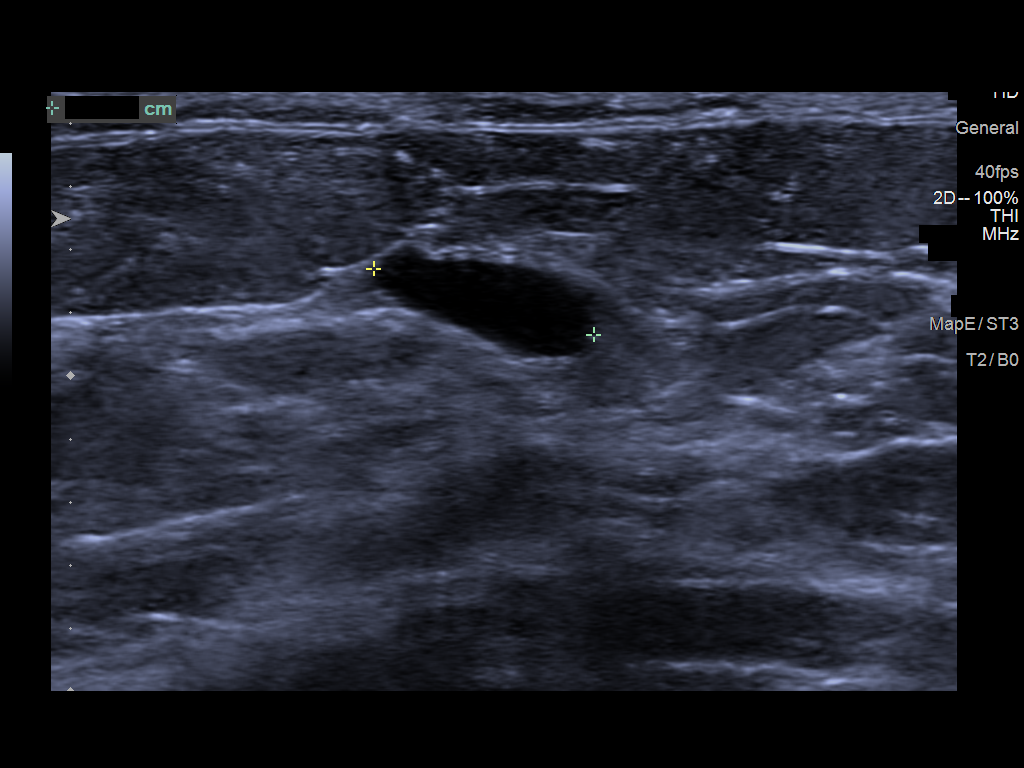
[im 3/6]
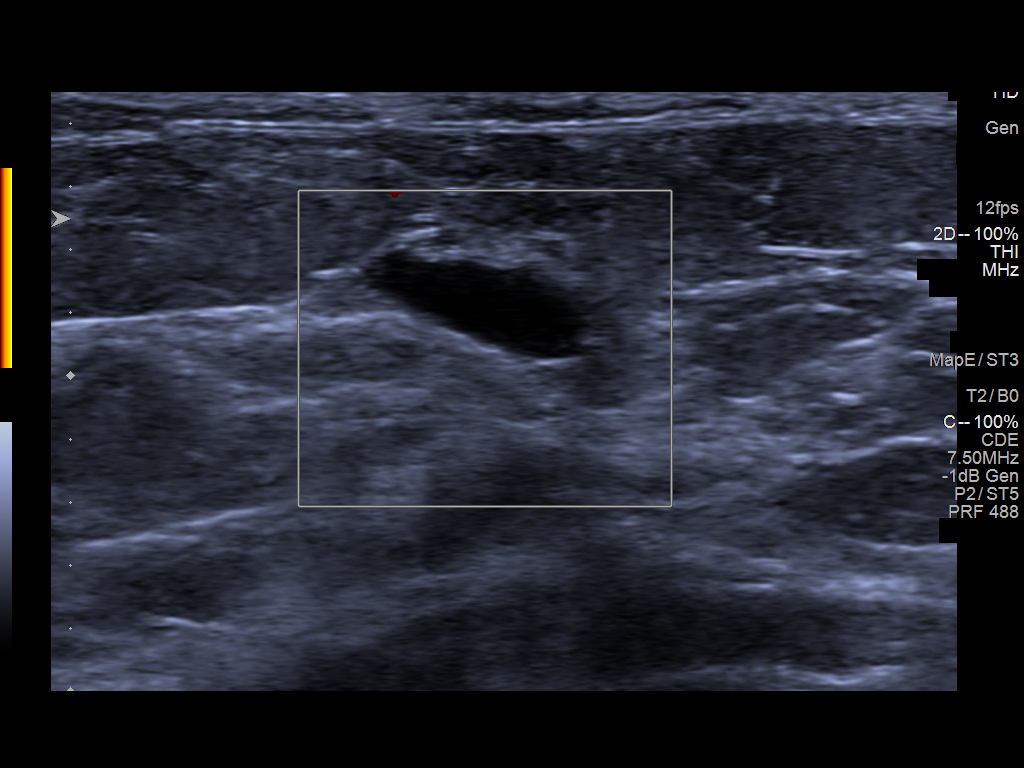
[im 4/6]
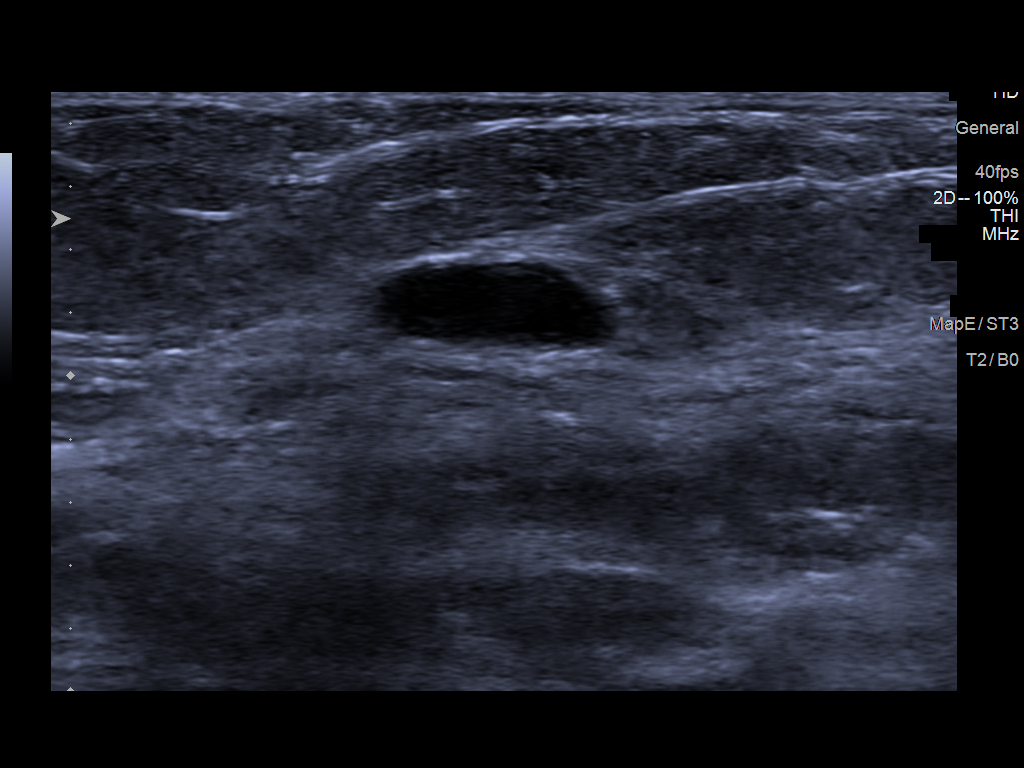
[im 5/6]
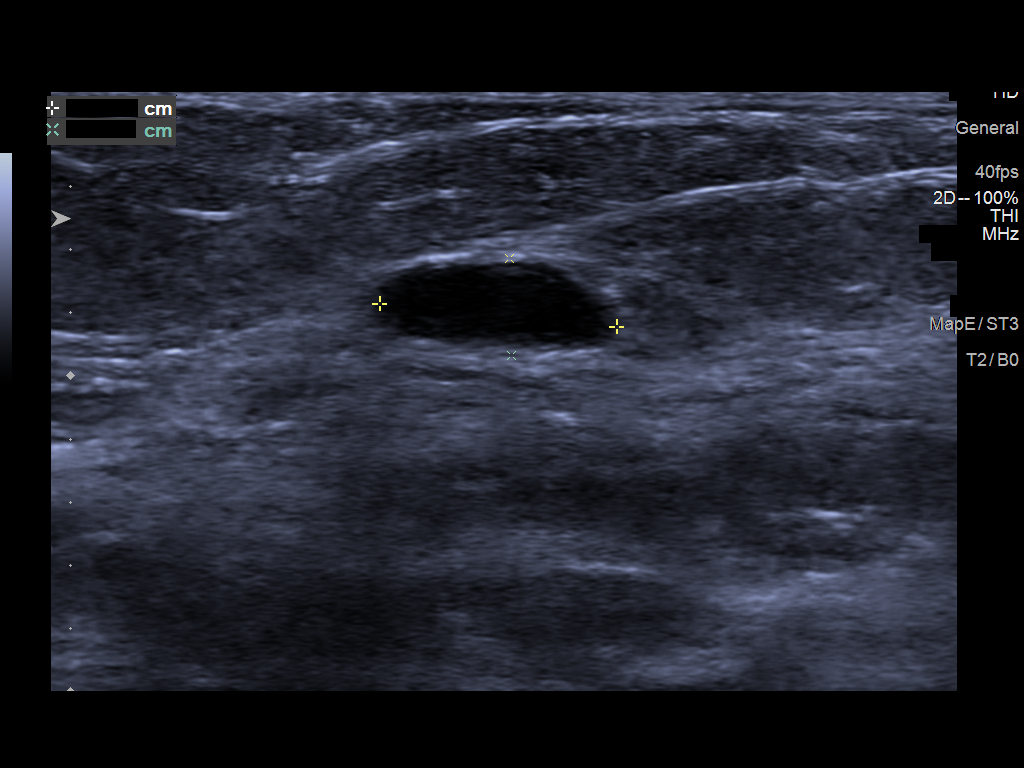
[im 6/6]
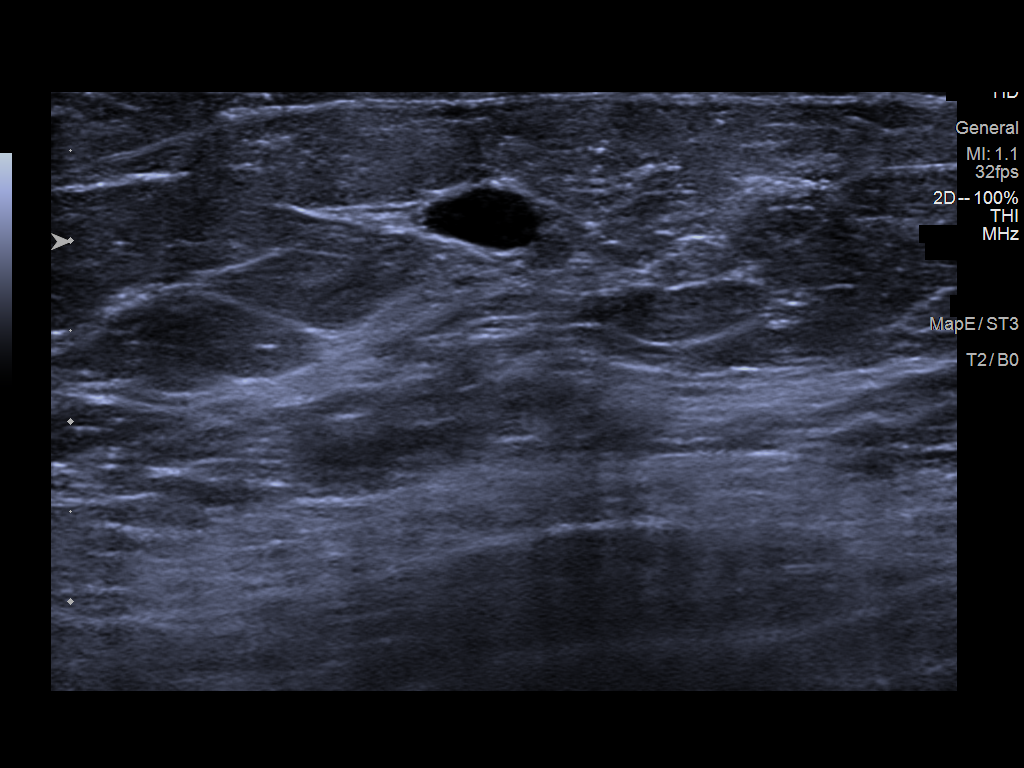

[6 of 6 positions shown; findings below may reference images not displayed]

ACR Breast Density Category b: There are scattered areas of
fibroglandular density.
FINDINGS: The possible asymmetry, noted on the current screening exam,
persists as an oval mostly circumscribed mass in the lateral right
breast, 7 mm in long axis, best seen on the spot-compression cc
view.

Targeted right breast ultrasound is performed, showing an oval cyst
at 8:30 o'clock, 5 cm the nipple, measuring 8 x 3 x 7 mm, consistent
in size, shape and location to the mammographic mass and
corresponding to the original screening asymmetry. There are no
solid masses or suspicious lesions.
IMPRESSION: 1. No evidence of breast malignancy.
2. Benign right breast cyst.

RECOMMENDATION:
Screening mammogram in one year.(Code:0Q-V-8P1)

I have discussed the findings and recommendations with the patient.
If applicable, a reminder letter will be sent to the patient
regarding the next appointment.

BI-RADS CATEGORY  2: Benign.

## 2022-02-04 IMAGING — MG MM DIGITAL DIAGNOSTIC UNILAT*R* W/ TOMO W/ CAD
6 series · 6 of 18 positions shown · non-contrast
Comparison: Previous exam(s).

CLINICAL DATA: Screening recall for a possible right breast
asymmetry.

EXAM:
DIGITAL DIAGNOSTIC UNILATERAL RIGHT MAMMOGRAM WITH TOMOSYNTHESIS AND
CAD; ULTRASOUND RIGHT BREAST LIMITED
TECHNIQUE: Right digital diagnostic mammography and breast tomosynthesis was
performed. The images were evaluated with computer-aided detection.;
Targeted ultrasound examination of the right breast was performed

[R CC synth-2D]
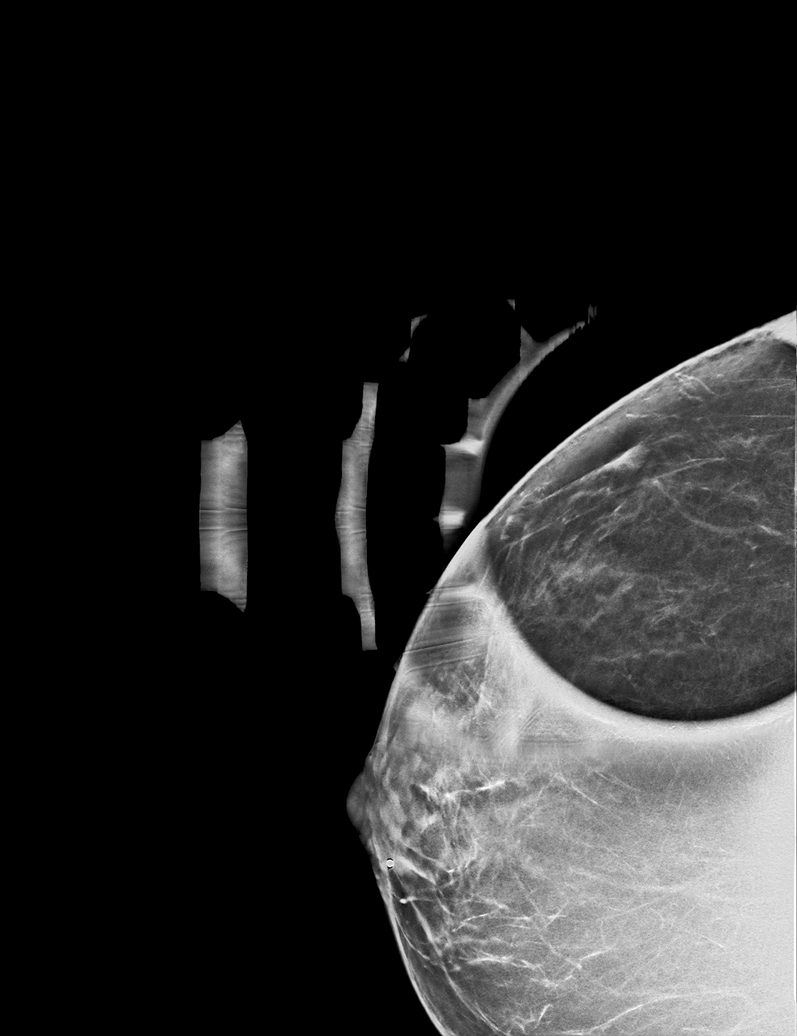

[R ML synth-2D]
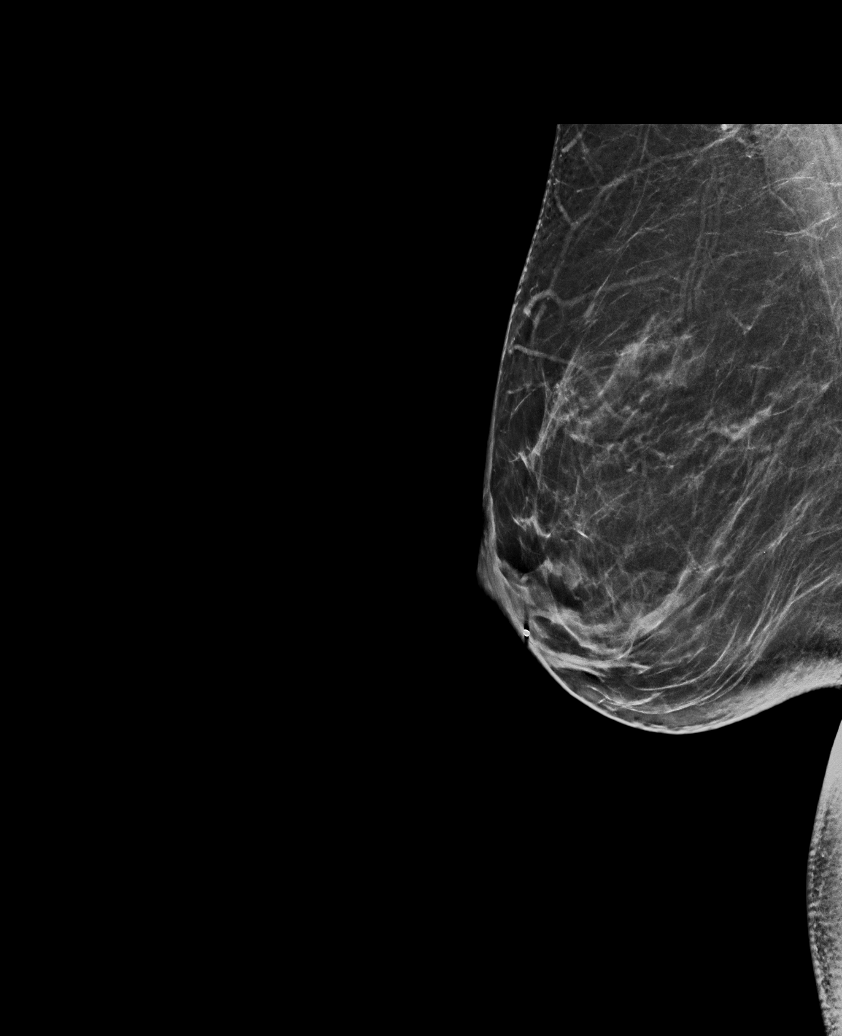

[R MLO synth-2D]
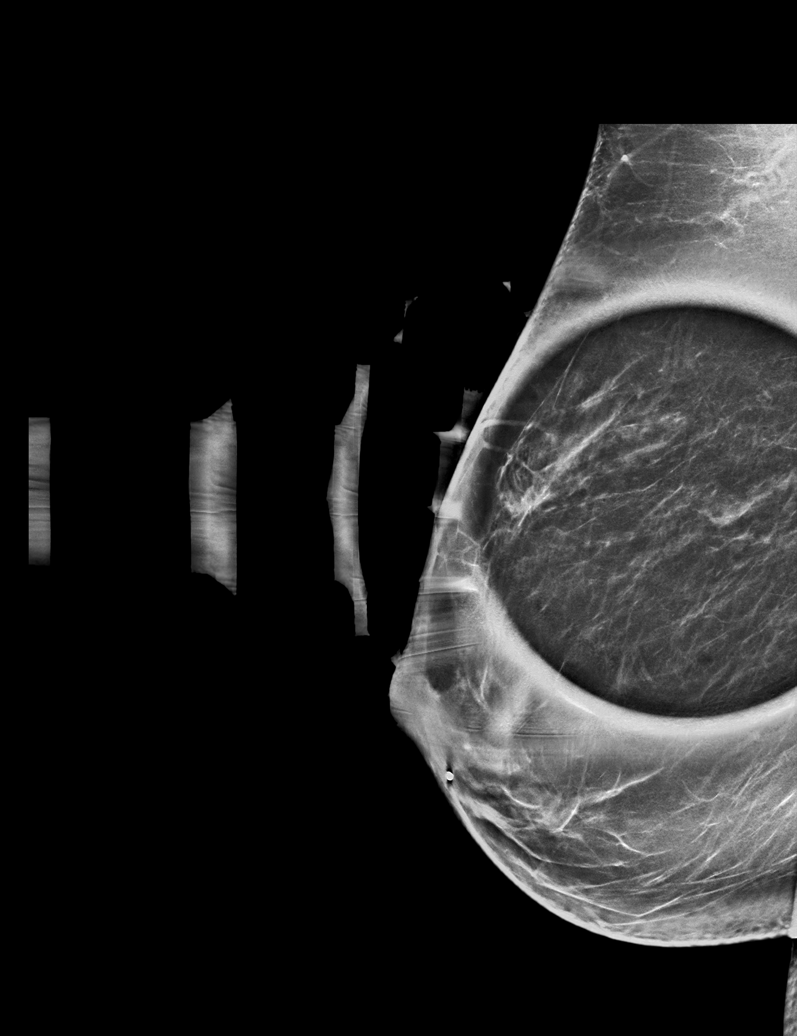

[R MLO tomo · tomo slice 33/64.0]
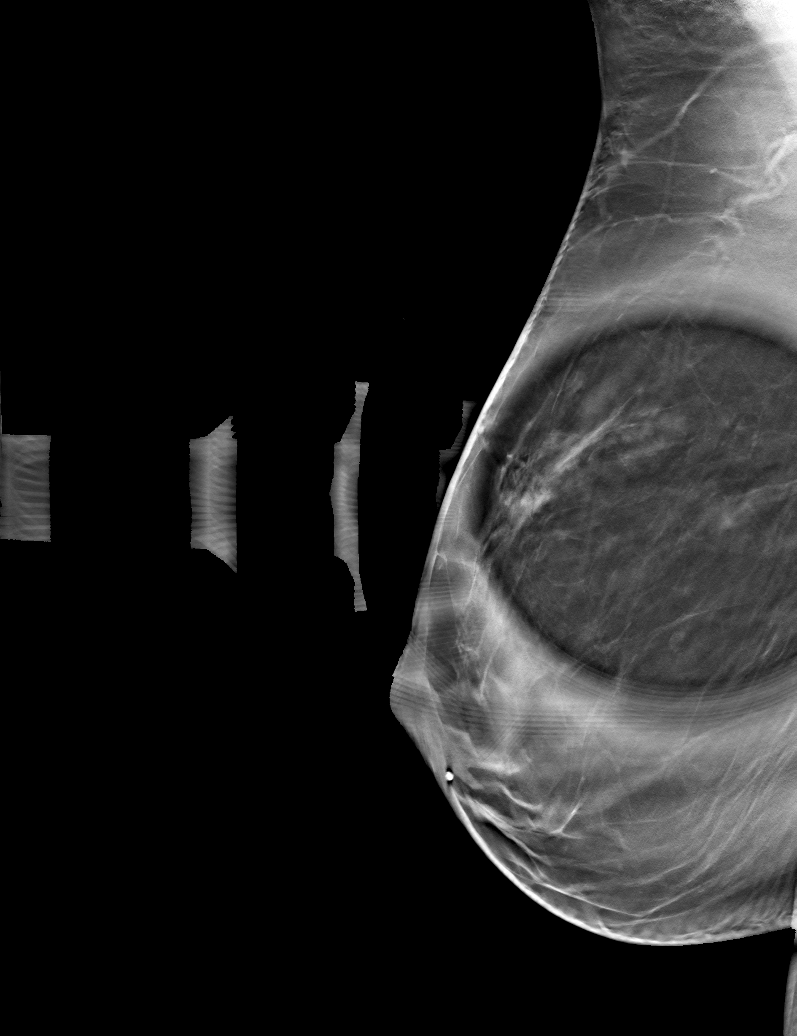

[R CC tomo · tomo slice 27/53.0]
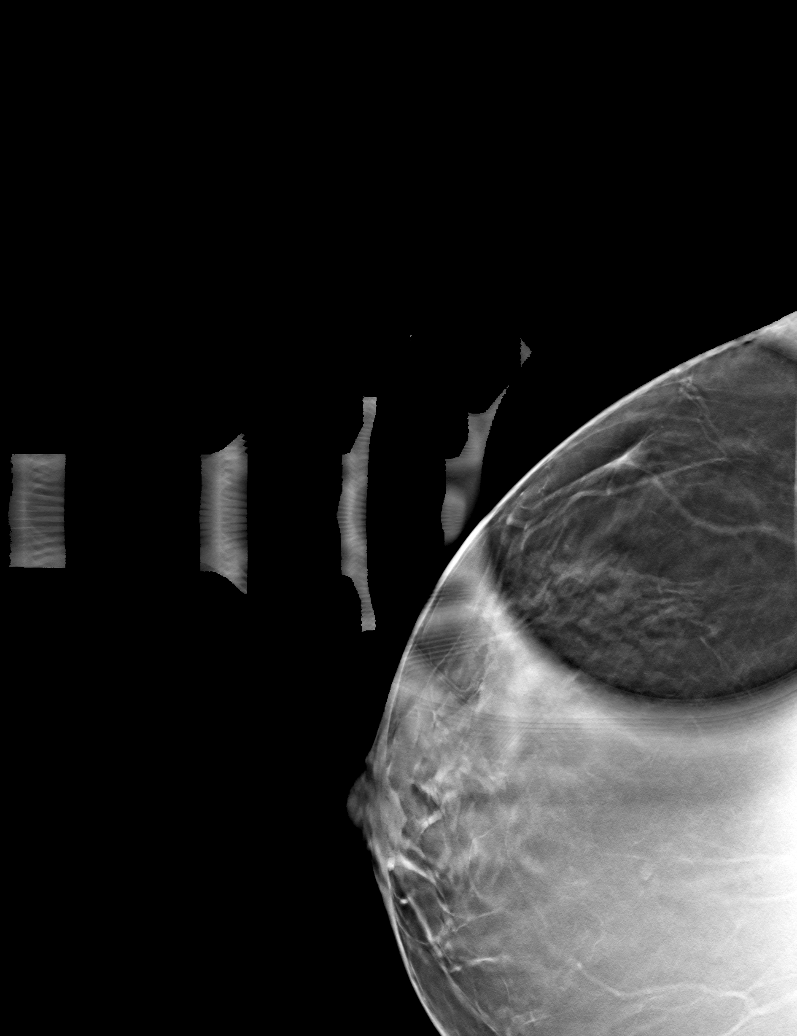

[R ML tomo · tomo slice 37/72.0]
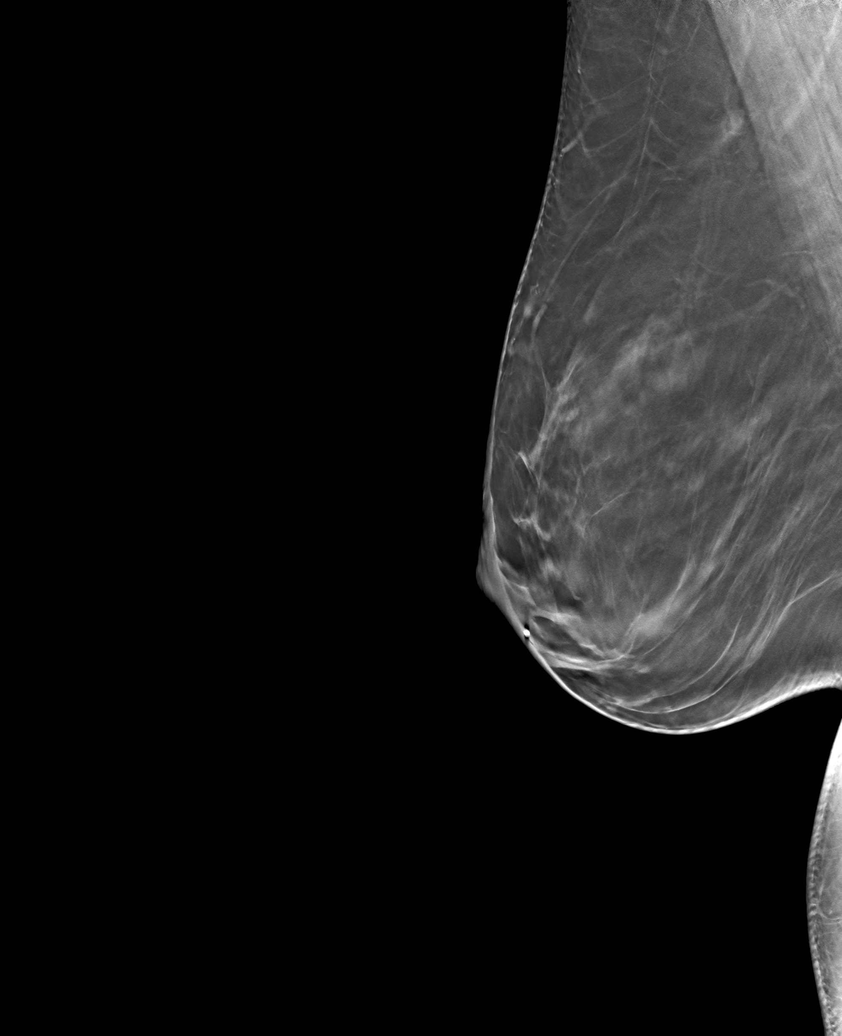

[6 of 18 positions shown; findings below may reference images not displayed]

ACR Breast Density Category b: There are scattered areas of
fibroglandular density.
FINDINGS: The possible asymmetry, noted on the current screening exam,
persists as an oval mostly circumscribed mass in the lateral right
breast, 7 mm in long axis, best seen on the spot-compression cc
view.

Targeted right breast ultrasound is performed, showing an oval cyst
at 8:30 o'clock, 5 cm the nipple, measuring 8 x 3 x 7 mm, consistent
in size, shape and location to the mammographic mass and
corresponding to the original screening asymmetry. There are no
solid masses or suspicious lesions.
IMPRESSION: 1. No evidence of breast malignancy.
2. Benign right breast cyst.

RECOMMENDATION:
Screening mammogram in one year.(Code:[id number]-V-8P1)

I have discussed the findings and recommendations with the patient.
If applicable, a reminder letter will be sent to the patient
regarding the next appointment.

BI-RADS CATEGORY  2: Benign.

## 2022-04-09 ENCOUNTER — Other Ambulatory Visit: Payer: Self-pay | Admitting: Physician Assistant

## 2022-04-09 DIAGNOSIS — Z0001 Encounter for general adult medical examination with abnormal findings: Secondary | ICD-10-CM

## 2022-04-09 DIAGNOSIS — F325 Major depressive disorder, single episode, in full remission: Secondary | ICD-10-CM

## 2022-04-09 DIAGNOSIS — R300 Dysuria: Secondary | ICD-10-CM

## 2022-04-09 DIAGNOSIS — Z1231 Encounter for screening mammogram for malignant neoplasm of breast: Secondary | ICD-10-CM

## 2022-04-09 DIAGNOSIS — F411 Generalized anxiety disorder: Secondary | ICD-10-CM

## 2022-04-09 DIAGNOSIS — E6609 Other obesity due to excess calories: Secondary | ICD-10-CM

## 2022-04-09 DIAGNOSIS — E039 Hypothyroidism, unspecified: Secondary | ICD-10-CM

## 2022-04-09 DIAGNOSIS — E7800 Pure hypercholesterolemia, unspecified: Secondary | ICD-10-CM

## 2022-05-11 ENCOUNTER — Encounter: Payer: Self-pay | Admitting: Physician Assistant

## 2022-05-11 ENCOUNTER — Ambulatory Visit (INDEPENDENT_AMBULATORY_CARE_PROVIDER_SITE_OTHER): Payer: BC Managed Care – PPO | Admitting: Physician Assistant

## 2022-05-11 VITALS — BP 123/87 | HR 85 | Temp 98.2°F | Resp 16 | Ht 69.0 in | Wt 238.4 lb

## 2022-05-11 DIAGNOSIS — R739 Hyperglycemia, unspecified: Secondary | ICD-10-CM

## 2022-05-11 DIAGNOSIS — Z0001 Encounter for general adult medical examination with abnormal findings: Secondary | ICD-10-CM | POA: Diagnosis not present

## 2022-05-11 DIAGNOSIS — Z1211 Encounter for screening for malignant neoplasm of colon: Secondary | ICD-10-CM

## 2022-05-11 DIAGNOSIS — F325 Major depressive disorder, single episode, in full remission: Secondary | ICD-10-CM

## 2022-05-11 DIAGNOSIS — F411 Generalized anxiety disorder: Secondary | ICD-10-CM | POA: Diagnosis not present

## 2022-05-11 DIAGNOSIS — R300 Dysuria: Secondary | ICD-10-CM

## 2022-05-11 DIAGNOSIS — R5383 Other fatigue: Secondary | ICD-10-CM

## 2022-05-11 DIAGNOSIS — E782 Mixed hyperlipidemia: Secondary | ICD-10-CM

## 2022-05-11 DIAGNOSIS — E039 Hypothyroidism, unspecified: Secondary | ICD-10-CM

## 2022-05-11 DIAGNOSIS — Z1212 Encounter for screening for malignant neoplasm of rectum: Secondary | ICD-10-CM

## 2022-05-11 MED ORDER — TETANUS-DIPHTH-ACELL PERTUSSIS 5-2.5-18.5 LF-MCG/0.5 IM SUSY: 0.5000 mL | PREFILLED_SYRINGE | Freq: Once | INTRAMUSCULAR | 0 refills | Status: AC

## 2022-05-11 MED ORDER — SHINGRIX 50 MCG/0.5ML IM SUSR: 0.5000 mL | Freq: Once | INTRAMUSCULAR | 0 refills | Status: AC

## 2022-05-11 NOTE — Progress Notes (Signed)
[facility] [address] [place], [place] [address]  Internal MEDICINE  Office Visit Note  Patient Name: Chelsea Sheppard  [id number]  [id number]  Date of Service: 05/11/2022  Chief Complaint  Patient presents with   Annual Exam   Quality Metric Gaps    Shingles, TDAP, Cologuard     HPI Pt is here for routine health maintenance examination -had her mammogram in Dec -Will move forward with shingles and tdap vaccines -labs reviewed and vit D a little low and is taking vit D supplement already. Gluciose borderline high, mom was diabetic but never on meds. HCT also a little high. Unfortunately does still need to have thyroid labs checked and will order this now and go ahead and recheck cbc and check an A1c -Taking xanax only as needed, still doing well on bupropion and will continue -Due for colon screening, did cologuard previously and will have colonoscopy now  Current Medication: Outpatient Encounter Medications as of 05/11/2022  Medication Sig   ALPRAZolam (XANAX) 0.5 MG tablet Take 0.5-1 tablet by mouth at bedtime as needed for anxiety   atorvastatin (LIPITOR) 10 MG tablet TAKE 1 TABLET DAILY   buPROPion (WELLBUTRIN) 75 MG tablet TAKE 2 TABLETS EVERY MORNING FOR DEPRESSION   diclofenac sodium (VOLTAREN) 1 % GEL Apply 4 g topically 4 (four) times daily.   fluticasone (FLONASE) 50 MCG/ACT nasal spray Place 1 spray into both nostrils daily.   levothyroxine (SYNTHROID) 137 MCG tablet TAKE 1 TABLET DAILY BEFORE BREAKFAST   [DISCONTINUED] Tdap (BOOSTRIX) 5-2.5-18.5 LF-MCG/0.5 injection Inject 0.5 mLs into the muscle once.   [DISCONTINUED] Zoster Vaccine Adjuvanted [facility]) injection Inject 0.5 mLs into the muscle once.   Tdap (BOOSTRIX) 5-2.5-18.5 LF-MCG/0.5 injection Inject 0.5 mLs into the muscle once for 1 dose.   Zoster Vaccine Adjuvanted [facility]) injection Inject 0.5 mLs into the muscle once for 1 dose.   No facility-administered encounter medications on file as  of 05/11/2022.    Surgical History: History reviewed. No pertinent surgical history.  Medical History: Past Medical History:  Diagnosis Date   Anxiety    Environmental allergies    Hypothyroidism     Family History: Family History  Problem Relation Age of Onset   Diabetes Mother    Thyroid disease Mother    Hypertension Mother    Kidney cancer Mother        tumor on kidney and had kidney removed   Hypertension Father    Prostate cancer Father    Breast cancer Maternal Aunt 65   Breast cancer Maternal Grandmother        mat great gm   Breast cancer Cousin 40       pat cousin      Review of Systems  Constitutional:  Negative for activity change, appetite change, chills, fatigue, fever and unexpected weight change.  HENT: Negative.  Negative for congestion, ear pain, rhinorrhea, sore throat and trouble swallowing.   Eyes: Negative.   Respiratory: Negative.  Negative for cough, chest tightness, shortness of breath and wheezing.   Cardiovascular: Negative.  Negative for chest pain.  Gastrointestinal: Negative.  Negative for abdominal pain, blood in stool, constipation, diarrhea, nausea and vomiting.  Endocrine: Negative.   Genitourinary: Negative.  Negative for difficulty urinating, dysuria, frequency, hematuria and urgency.  Musculoskeletal: Negative.  Negative for arthralgias, back pain, joint swelling, myalgias and neck pain.  Skin: Negative.  Negative for rash and wound.  Allergic/Immunologic: Negative.  Negative for immunocompromised state.  Neurological: Negative.  Negative  for dizziness, seizures, numbness and headaches.  Hematological: Negative.   Psychiatric/Behavioral: Negative.  Negative for behavioral problems, self-injury and suicidal ideas. The patient is not nervous/anxious.      Vital Signs: BP 123/87   Pulse 85   Temp 98.2 F (36.8 C)   Resp 16   Ht 5\' 9"  (1.753 m)   Wt 238 lb 6.4 oz (108.1 kg)   SpO2 94%   BMI 35.21 kg/m    Physical  Exam Vitals and nursing note reviewed.  Constitutional:      General: She is not in acute distress.    Appearance: She is well-developed. She is obese. She is not diaphoretic.  HENT:     Head: Normocephalic and atraumatic.     Mouth/Throat:     Pharynx: No oropharyngeal exudate.  Eyes:     Pupils: Pupils are equal, round, and reactive to light.  Neck:     Thyroid: No thyromegaly.     Vascular: No JVD.     Trachea: No tracheal deviation.  Cardiovascular:     Rate and Rhythm: Normal rate and regular rhythm.     Heart sounds: Normal heart sounds. No murmur heard.    No friction rub. No gallop.  Pulmonary:     Effort: Pulmonary effort is normal. No respiratory distress.     Breath sounds: No wheezing or rales.  Chest:     Chest wall: No tenderness.  Abdominal:     General: Bowel sounds are normal.     Palpations: Abdomen is soft.     Tenderness: There is no abdominal tenderness.  Musculoskeletal:        General: Normal range of motion.     Cervical back: Normal range of motion and neck supple.  Lymphadenopathy:     Cervical: No cervical adenopathy.  Skin:    General: Skin is warm and dry.  Neurological:     Mental Status: She is alert and oriented to person, place, and time.     Cranial Nerves: No cranial nerve deficit.  Psychiatric:        Behavior: Behavior normal.        Thought Content: Thought content normal.        Judgment: Judgment normal.      LABS: No results found for this or any previous visit (from the past 2160 hour(s)).      Assessment/Plan: 1. Encounter for general adult medical examination with abnormal findings CPE performed, labs reviewed, mammogram UTD, due for colon screening  2. Acquired hypothyroidism Will check labs and adjust synthroid as needed - TSH + free T4  3. Major depressive disorder, single episode, in remission Stable, continue wellbutrin as before  4. Generalized anxiety disorder Stable, continue wellbutrin as before. May  continue xanax prn  5. Elevated blood sugar - Hgb A1C w/o eAG  6. Screening for colorectal cancer - Ambulatory referral to Gastroenterology  7. Mixed hyperlipidemia Greatly improved, continue lipitor  8. Other fatigue - CBC w/Diff/Platelet  9. Dysuria - UA/M w/rflx Culture, Routine   General Counseling: Chelsea Sheppard verbalizes understanding of the findings of todays visit and agrees with plan of treatment. I have discussed any further diagnostic evaluation that may be needed or ordered today. We also reviewed her medications today. she has been encouraged to call the office with any questions or concerns that should arise related to todays visit.    Counseling:    Orders Placed This Encounter  Procedures   UA/M w/rflx Culture, Routine  TSH + free T4   CBC w/Diff/Platelet   Hgb A1C w/o eAG   Ambulatory referral to Gastroenterology    Meds ordered this encounter  Medications   Tdap (BOOSTRIX) 5-2.5-18.5 LF-MCG/0.5 injection    Sig: Inject 0.5 mLs into the muscle once for 1 dose.    Dispense:  0.5 mL    Refill:  0   Zoster Vaccine Adjuvanted [facility]) injection    Sig: Inject 0.5 mLs into the muscle once for 1 dose.    Dispense:  0.5 mL    Refill:  0    This patient was seen by Lynn Ito, PA-C in collaboration with Dr. Beverely Risen as a part of collaborative care agreement.  Total time spent:35 Minutes  Time spent includes review of chart, medications, test results, and follow up plan with the patient.     Lyndon Code, MD  Internal Medicine

## 2022-05-14 ENCOUNTER — Encounter: Payer: BC Managed Care – PPO | Admitting: Internal Medicine

## 2022-05-14 DIAGNOSIS — R739 Hyperglycemia, unspecified: Secondary | ICD-10-CM | POA: Diagnosis not present

## 2022-05-14 DIAGNOSIS — R5383 Other fatigue: Secondary | ICD-10-CM | POA: Diagnosis not present

## 2022-05-14 DIAGNOSIS — E039 Hypothyroidism, unspecified: Secondary | ICD-10-CM | POA: Diagnosis not present

## 2022-05-14 LAB — MICROSCOPIC EXAMINATION
Bacteria, UA: NONE SEEN
Casts: NONE SEEN /lpf

## 2022-05-14 LAB — UA/M W/RFLX CULTURE, ROUTINE
Bilirubin, UA: NEGATIVE
Glucose, UA: NEGATIVE
Ketones, UA: NEGATIVE
Nitrite, UA: NEGATIVE
Protein,UA: NEGATIVE
RBC, UA: NEGATIVE
Specific Gravity, UA: 1.006 (ref 1.005–1.030)
Urobilinogen, Ur: 0.2 mg/dL (ref 0.2–1.0)
pH, UA: 7.5 (ref 5.0–7.5)

## 2022-05-14 LAB — URINE CULTURE, REFLEX

## 2022-05-15 LAB — CBC WITH DIFFERENTIAL/PLATELET
Basophils Absolute: 0.1 10*3/uL (ref 0.0–0.2)
Basos: 1 %
EOS (ABSOLUTE): 0.2 10*3/uL (ref 0.0–0.4)
Eos: 3 %
Hematocrit: 47.2 % — ABNORMAL HIGH (ref 34.0–46.6)
Hemoglobin: 15.4 g/dL (ref 11.1–15.9)
Immature Grans (Abs): 0 10*3/uL (ref 0.0–0.1)
Immature Granulocytes: 0 %
Lymphocytes Absolute: 2 10*3/uL (ref 0.7–3.1)
Lymphs: 26 %
MCH: 30.7 pg (ref 26.6–33.0)
MCHC: 32.6 g/dL (ref 31.5–35.7)
MCV: 94 fL (ref 79–97)
Monocytes Absolute: 0.6 10*3/uL (ref 0.1–0.9)
Monocytes: 8 %
Neutrophils Absolute: 4.8 10*3/uL (ref 1.4–7.0)
Neutrophils: 62 %
Platelets: 250 10*3/uL (ref 150–450)
RBC: 5.02 x10E6/uL (ref 3.77–5.28)
RDW: 11.9 % (ref 11.7–15.4)
WBC: 7.7 10*3/uL (ref 3.4–10.8)

## 2022-05-15 LAB — TSH+FREE T4
Free T4: 1.53 ng/dL (ref 0.82–1.77)
TSH: 3.35 u[IU]/mL (ref 0.450–4.500)

## 2022-05-15 LAB — HGB A1C W/O EAG: Hgb A1c MFr Bld: 5.5 % (ref 4.8–5.6)

## 2022-05-16 ENCOUNTER — Telehealth: Payer: Self-pay | Admitting: *Deleted

## 2022-05-16 ENCOUNTER — Other Ambulatory Visit: Payer: Self-pay | Admitting: *Deleted

## 2022-05-16 DIAGNOSIS — Z1211 Encounter for screening for malignant neoplasm of colon: Secondary | ICD-10-CM

## 2022-05-16 MED ORDER — NA SULFATE-K SULFATE-MG SULF 17.5-3.13-1.6 GM/177ML PO SOLN: 1.0000 | Freq: Once | ORAL | 0 refills | Status: AC

## 2022-05-16 NOTE — Telephone Encounter (Signed)
Gastroenterology Pre-Procedure Review  Request Date: 06/08/2022 Requesting Physician: Dr. Servando Snare  PATIENT REVIEW QUESTIONS: The patient responded to the following health history questions as indicated:    1. Are you having any GI issues? no 2. Do you have a personal history of Polyps? no 3. Do you have a family history of Colon Cancer or Polyps? no 4. Diabetes Mellitus? no 5. Joint replacements in the past 12 months?no 6. Major health problems in the past 3 months?no 7. Any artificial heart valves, MVP, or defibrillator?no    MEDICATIONS & ALLERGIES:    Patient reports the following regarding taking any anticoagulation/antiplatelet therapy:   Plavix, Coumadin, Eliquis, Xarelto, Lovenox, Pradaxa, Brilinta, or Effient? no Aspirin? no  Patient confirms/reports the following medications:  Current Outpatient Medications  Medication Sig Dispense Refill   ALPRAZolam (XANAX) 0.5 MG tablet Take 0.5-1 tablet by mouth at bedtime as needed for anxiety 90 tablet 0   atorvastatin (LIPITOR) 10 MG tablet TAKE 1 TABLET DAILY 90 tablet 3   buPROPion (WELLBUTRIN) 75 MG tablet TAKE 2 TABLETS EVERY MORNING FOR DEPRESSION 180 tablet 3   diclofenac sodium (VOLTAREN) 1 % GEL Apply 4 g topically 4 (four) times daily. 100 g 1   fluticasone (FLONASE) 50 MCG/ACT nasal spray Place 1 spray into both nostrils daily.     levothyroxine (SYNTHROID) 137 MCG tablet TAKE 1 TABLET DAILY BEFORE BREAKFAST 90 tablet 3   No current facility-administered medications for this visit.    Patient confirms/reports the following allergies:  Allergies  Allergen Reactions   Amoxicillin Hives   Celecoxib Hives   Cox-2 Inhibitors    Etodolac Other (See Comments)    incontinent   Nsaids    Salicylates Hives    No orders of the defined types were placed in this encounter.   AUTHORIZATION INFORMATION Primary Insurance: 1D#: Group #:  Secondary Insurance: 1D#: Group #:  SCHEDULE INFORMATION: Date:  06/08/2022 Time: Location:  [place]

## 2022-05-17 ENCOUNTER — Telehealth: Payer: Self-pay

## 2022-05-17 NOTE — Telephone Encounter (Signed)
-----   Message from Carlean Jews, PA-C sent at 05/15/2022  3:24 PM EDT ----- Please let her know that her labs looked good. Thyroid still in range and A1c normal

## 2022-05-17 NOTE — Telephone Encounter (Signed)
Spoke with patient regarding lab results. 

## 2022-05-30 ENCOUNTER — Encounter: Payer: Self-pay | Admitting: Gastroenterology

## 2022-06-08 ENCOUNTER — Encounter: Payer: Self-pay | Admitting: Gastroenterology

## 2022-06-08 ENCOUNTER — Ambulatory Visit
Admission: RE | Admit: 2022-06-08 | Discharge: 2022-06-08 | Disposition: A | Discharge status: Home / Self Care | Payer: BC Managed Care – PPO | Attending: Gastroenterology | Admitting: Gastroenterology

## 2022-06-08 ENCOUNTER — Encounter
Admission: RE | Disposition: A | Discharge status: Home / Self Care | Payer: Self-pay | Source: Home / Self Care | Attending: Gastroenterology

## 2022-06-08 ENCOUNTER — Ambulatory Visit: Payer: BC Managed Care – PPO | Admitting: Anesthesiology

## 2022-06-08 ENCOUNTER — Other Ambulatory Visit: Payer: Self-pay

## 2022-06-08 DIAGNOSIS — Z79899 Other long term (current) drug therapy: Secondary | ICD-10-CM | POA: Diagnosis not present

## 2022-06-08 DIAGNOSIS — E039 Hypothyroidism, unspecified: Secondary | ICD-10-CM | POA: Diagnosis not present

## 2022-06-08 DIAGNOSIS — Z87891 Personal history of nicotine dependence: Secondary | ICD-10-CM | POA: Diagnosis not present

## 2022-06-08 DIAGNOSIS — F329 Major depressive disorder, single episode, unspecified: Secondary | ICD-10-CM | POA: Insufficient documentation

## 2022-06-08 DIAGNOSIS — F419 Anxiety disorder, unspecified: Secondary | ICD-10-CM | POA: Diagnosis not present

## 2022-06-08 DIAGNOSIS — Z79890 Hormone replacement therapy (postmenopausal): Secondary | ICD-10-CM | POA: Diagnosis not present

## 2022-06-08 DIAGNOSIS — Z1211 Encounter for screening for malignant neoplasm of colon: Secondary | ICD-10-CM

## 2022-06-08 DIAGNOSIS — K635 Polyp of colon: Secondary | ICD-10-CM

## 2022-06-08 DIAGNOSIS — D126 Benign neoplasm of colon, unspecified: Secondary | ICD-10-CM | POA: Diagnosis not present

## 2022-06-08 HISTORY — PX: COLONOSCOPY WITH PROPOFOL: SHX5780

## 2022-06-08 SURGERY — COLONOSCOPY WITH PROPOFOL
Anesthesia: General | Site: Rectum

## 2022-06-08 MED ORDER — LACTATED RINGERS IV SOLN: INTRAVENOUS | Status: DC

## 2022-06-08 MED ORDER — SODIUM CHLORIDE 0.9 % IV SOLN: INTRAVENOUS | Status: DC

## 2022-06-08 MED ORDER — PROPOFOL 10 MG/ML IV BOLUS
INTRAVENOUS | Status: DC | PRN
  Administered 2022-06-08: 40 mg via INTRAVENOUS
  Administered 2022-06-08: 50 mg via INTRAVENOUS
  Administered 2022-06-08: 30 mg via INTRAVENOUS
  Administered 2022-06-08 (×2): 20 mg via INTRAVENOUS
  Administered 2022-06-08: 80 mg via INTRAVENOUS
  Administered 2022-06-08 (×2): 30 mg via INTRAVENOUS

## 2022-06-08 MED ORDER — STERILE WATER FOR IRRIGATION IR SOLN
Status: DC | PRN
  Administered 2022-06-08: 150 mL

## 2022-06-08 MED ORDER — LIDOCAINE HCL (CARDIAC) PF 100 MG/5ML IV SOSY
PREFILLED_SYRINGE | INTRAVENOUS | Status: DC | PRN
  Administered 2022-06-08: 40 mg via INTRAVENOUS

## 2022-06-08 SURGICAL SUPPLY — 21 items

## 2022-06-08 NOTE — Anesthesia Preprocedure Evaluation (Signed)
Anesthesia Evaluation  Patient identified by MRN, date of birth, ID band Patient awake    Reviewed: Allergy & Precautions, H&P , NPO status , Patient's Chart, lab work & pertinent test results  Airway Mallampati: II  TM Distance: >3 FB Neck ROM: Full    Dental no notable dental hx.    Pulmonary neg pulmonary ROS, former smoker   Pulmonary exam normal breath sounds clear to auscultation       Cardiovascular negative cardio ROS Normal cardiovascular exam Rhythm:Regular Rate:Normal     Neuro/Psych  PSYCHIATRIC DISORDERS Anxiety Depression    negative neurological ROS  negative psych ROS   GI/Hepatic negative GI ROS, Neg liver ROS,,,  Endo/Other  negative endocrine ROSHypothyroidism    Renal/GU negative Renal ROS  negative genitourinary   Musculoskeletal negative musculoskeletal ROS (+) Arthritis ,    Abdominal   Peds negative pediatric ROS (+)  Hematology negative hematology ROS (+)   Anesthesia Other Findings Anxiety Hypothyroidism Environmental allergies  Note: patient having "irregular" menses. Discussed this with patient. She has not seen gynecologist to follow up with this, encouraged patient to have this checked out.   Reproductive/Obstetrics negative OB ROS                             Anesthesia Physical Anesthesia Plan  ASA: 2  Anesthesia Plan: General   Post-op Pain Management:    Induction: Intravenous  PONV Risk Score and Plan:   Airway Management Planned: Natural Airway and Nasal Cannula  Additional Equipment:   Intra-op Plan:   Post-operative Plan:   Informed Consent: I have reviewed the patients History and Physical, chart, labs and discussed the procedure including the risks, benefits and alternatives for the proposed anesthesia with the patient or authorized representative who has indicated his/her understanding and acceptance.     Dental Advisory  Given  Plan Discussed with: Anesthesiologist, CRNA and Surgeon  Anesthesia Plan Comments: (Patient consented for risks of anesthesia including but not limited to:  - adverse reactions to medications - risk of airway placement if required - damage to eyes, teeth, lips or other oral mucosa - nerve damage due to positioning  - sore throat or hoarseness - Damage to heart, brain, nerves, lungs, other parts of body or loss of life  Patient voiced understanding.)       Anesthesia Quick Evaluation

## 2022-06-08 NOTE — Op Note (Signed)
[facility] Gastroenterology Patient Name: Chelsea Sheppard Procedure Date: 06/08/2022 8:42 AM MRN: [id number] Account #: [id number] Date of Birth: [birth year] Admit Type: Outpatient Age: 57 Room: [facility] OR ROOM 01 Gender: Female Note Status: Finalized Instrument Name: [id number] Procedure:             Colonoscopy Indications:           Screening for colorectal malignant neoplasm Providers:             [name] MD, MD Referring MD:          Lyndon Code, MD (Referring MD) Medicines:             Propofol per Anesthesia Complications:         No immediate complications. Procedure:             Pre-Anesthesia Assessment:                        - Prior to the procedure, a History and Physical was                         performed, and patient medications and allergies were                         reviewed. The patient's tolerance of previous                         anesthesia was also reviewed. The risks and benefits                         of the procedure and the sedation options and risks                         were discussed with the patient. All questions were                         answered, and informed consent was obtained. Prior                         Anticoagulants: The patient has taken no anticoagulant                         or antiplatelet agents. ASA Grade Assessment: II - A                         patient with mild systemic disease. After reviewing                         the risks and benefits, the patient was deemed in                         satisfactory condition to undergo the procedure.                        After obtaining informed consent, the colonoscope was                         passed under direct vision. Throughout the procedure,  the patient's blood pressure, pulse, and oxygen                         saturations were monitored continuously. The                         Colonoscope was introduced through the anus and                          advanced to the the cecum, identified by appendiceal                         orifice and ileocecal valve. The colonoscopy was                         performed without difficulty. The patient tolerated                         the procedure well. The quality of the bowel                         preparation was excellent. Findings:      The perianal and digital rectal examinations were normal.      Two sessile polyps were found in the sigmoid colon. The polyps were 3 to       4 mm in size. These polyps were removed with a cold snare. Resection and       retrieval were complete.      A 4 mm polyp was found in the transverse colon. The polyp was sessile.       The polyp was removed with a cold snare. Resection and retrieval were       complete. Impression:            - Two 3 to 4 mm polyps in the sigmoid colon, removed                         with a cold snare. Resected and retrieved.                        - One 4 mm polyp in the transverse colon, removed with                         a cold snare. Resected and retrieved. Recommendation:        - Discharge patient to home.                        - Resume previous diet.                        - Continue present medications.                        - Await pathology results.                        - If the pathology report reveals adenomatous tissue,                         then repeat the colonoscopy for surveillance  in 5                         years. Procedure Code(s):     --- Professional ---                        [phone], Colonoscopy, flexible; with removal of                         tumor(s), polyp(s), or other lesion(s) by snare                         technique Diagnosis Code(s):     --- Professional ---                        Z12.11, Encounter for screening for malignant neoplasm                         of colon                        D12.5, Benign neoplasm of sigmoid colon CPT copyright 2022 American Medical  Association. All rights reserved. The codes documented in this report are preliminary and upon coder review may  be revised to meet current compliance requirements. [name] MD, MD 06/08/2022 9:09:55 AM This report has been signed electronically. Number of Addenda: 0 Note Initiated On: 06/08/2022 8:42 AM Scope Withdrawal Time: 0 hours 8 minutes 4 seconds  Total Procedure Duration: 0 hours 16 minutes 58 seconds  Estimated Blood Loss:  Estimated blood loss: none.      [facility]

## 2022-06-08 NOTE — Anesthesia Postprocedure Evaluation (Signed)
Anesthesia Post Note  Patient: Chelsea Sheppard  Procedure(s) Performed: COLONOSCOPY WITH PROPOFOL (Rectum)  Patient location during evaluation: PACU Anesthesia Type: General Level of consciousness: awake and alert Pain management: pain level controlled Vital Signs Assessment: post-procedure vital signs reviewed and stable Respiratory status: spontaneous breathing, nonlabored ventilation, respiratory function stable and patient connected to nasal cannula oxygen Cardiovascular status: blood pressure returned to baseline and stable Postop Assessment: no apparent nausea or vomiting Anesthetic complications: no   No notable events documented.   Last Vitals:  Vitals:   06/08/22 0910 06/08/22 0920  BP: 109/78 121/73  Pulse: 83 86  Resp: 18 20  Temp: (!) 36.4 C (!) 36.4 C  SpO2: 95% 96%    Last Pain:  Vitals:   06/08/22 0920  PainSc: 0-No pain                 Banessa Mao C Matei Magnone

## 2022-06-08 NOTE — Transfer of Care (Signed)
Immediate Anesthesia Transfer of Care Note  Patient: Chelsea Sheppard  Procedure(s) Performed: COLONOSCOPY WITH PROPOFOL (Rectum)  Patient Location: PACU  Anesthesia Type: General  Level of Consciousness: awake, alert  and patient cooperative  Airway and Oxygen Therapy: Patient Spontanous Breathing and Patient connected to supplemental oxygen  Post-op Assessment: Post-op Vital signs reviewed, Patient's Cardiovascular Status Stable, Respiratory Function Stable, Patent Airway and No signs of Nausea or vomiting  Post-op Vital Signs: Reviewed and stable  Complications: No notable events documented.

## 2022-06-08 NOTE — H&P (Signed)
Chelsea Minium, MD [facility] [address] [place], [place] [address] Phone: [phone] Fax : [phone]  Primary Care Physician:  Lyndon Code, MD Primary Gastroenterologist:  Dr. Servando Snare  Pre-Procedure History & Physical: HPI:  Chelsea Sheppard is a 57 y.o. female is here for a screening colonoscopy.   Past Medical History:  Diagnosis Date   Anxiety    Environmental allergies    Hypothyroidism     History reviewed. No pertinent surgical history.  Prior to Admission medications   Medication Sig Start Date End Date Taking? Authorizing Provider  ALPRAZolam Prudy Feeler) 0.5 MG tablet Take 0.5-1 tablet by mouth at bedtime as needed for anxiety 04/18/20  Yes McDonough, Lauren K, PA-C  Ascorbic Acid (VITAMIN C) 1000 MG tablet Take 1,000 mg by mouth daily.   Yes [provider]  atorvastatin (LIPITOR) 10 MG tablet TAKE 1 TABLET DAILY 09/18/21  Yes Lyndon Code, MD  buPROPion [facility]) 75 MG tablet TAKE 2 TABLETS EVERY MORNING FOR DEPRESSION 09/04/21  Yes McDonough, Salomon Fick, PA-C  cholecalciferol (VITAMIN D3) 25 MCG (1000 UNIT) tablet Take 1,000 Units by mouth daily.   Yes [provider]  cyanocobalamin (VITAMIN B12) 1000 MCG tablet Take 1,000 mcg by mouth daily.   Yes [provider]  fluticasone (FLONASE) 50 MCG/ACT nasal spray Place 1 spray into both nostrils daily.   Yes [provider]  Glucosamine 750 MG TABS Take by mouth daily.   Yes [provider]  Homeopathic Products (ALLERGY MEDICINE PO) Take by mouth daily.   Yes [provider]  levothyroxine (SYNTHROID) 137 MCG tablet TAKE 1 TABLET DAILY BEFORE BREAKFAST 04/09/22  Yes McDonough, Lauren K, PA-C  Multiple Vitamin (MULTIVITAMIN) capsule Take 1 capsule by mouth daily.   Yes [provider]  diclofenac sodium (VOLTAREN) 1 % GEL Apply 4 g topically 4 (four) times daily. 11/06/17   Carlean Jews, NP    Allergies as of 05/16/2022 - Review Complete 05/11/2022   Allergen Reaction Noted   Amoxicillin Hives 12/28/2016   Celecoxib Hives 02/23/2015   Cox-2 inhibitors  12/28/2016   Etodolac Other (See Comments) 12/28/2016   Nsaids  12/28/2016   Salicylates Hives 12/28/2016    Family History  Problem Relation Age of Onset   Diabetes Mother    Thyroid disease Mother    Hypertension Mother    Kidney cancer Mother        tumor on kidney and had kidney removed   Hypertension Father    Prostate cancer Father    Breast cancer Maternal Aunt 4   Breast cancer Maternal Grandmother        mat great gm   Breast cancer Cousin 40       pat cousin    Social History   Socioeconomic History   Marital status: Married    Spouse name: Not on file   Number of children: Not on file   Years of education: Not on file   Highest education level: Not on file  Occupational History   Not on file  Tobacco Use   Smoking status: Former    Types: Cigarettes   Smokeless tobacco: Never  Substance and Sexual Activity   Alcohol use: Not Currently    Alcohol/week: 1.0 - 2.0 standard drink of alcohol    Types: 1 - 2 Standard drinks or equivalent per week    Comment: social   Drug use: No   Sexual activity: Not on file  Other Topics Concern  Not on file  Social History Narrative   Not on file   Social Determinants of Health   Financial Resource Strain: Not on file  Food Insecurity: Not on file  Transportation Needs: Not on file  Physical Activity: Not on file  Stress: Not on file  Social Connections: Not on file  Intimate Partner Violence: Not on file    Review of Systems: See HPI, otherwise negative ROS  Physical Exam: BP (!) 147/85   Pulse 88   Temp 98.4 F (36.9 C)   Ht 5\' 8"  (1.727 m)   Wt 106.1 kg   SpO2 94%   BMI 35.58 kg/m  General:   Alert,  pleasant and cooperative in NAD Head:  Normocephalic and atraumatic. Neck:  Supple; no masses or thyromegaly. Lungs:  Clear throughout to auscultation.    Heart:  Regular rate and  rhythm. Abdomen:  Soft, nontender and nondistended. Normal bowel sounds, without guarding, and without rebound.   Neurologic:  Alert and  oriented x4;  grossly normal neurologically.  Impression/Plan: Chelsea Sheppard is now here to undergo a screening colonoscopy.  Risks, benefits, and alternatives regarding colonoscopy have been reviewed with the patient.  Questions have been answered.  All parties agreeable.

## 2022-06-11 ENCOUNTER — Encounter: Payer: Self-pay | Admitting: Gastroenterology

## 2022-06-20 ENCOUNTER — Other Ambulatory Visit: Payer: Self-pay

## 2022-06-21 ENCOUNTER — Encounter: Payer: Self-pay | Admitting: Gastroenterology

## 2022-09-26 ENCOUNTER — Other Ambulatory Visit: Payer: Self-pay | Admitting: Internal Medicine

## 2022-09-26 ENCOUNTER — Other Ambulatory Visit: Payer: Self-pay | Admitting: Physician Assistant

## 2022-09-26 DIAGNOSIS — E7800 Pure hypercholesterolemia, unspecified: Secondary | ICD-10-CM

## 2022-09-26 DIAGNOSIS — F325 Major depressive disorder, single episode, in full remission: Secondary | ICD-10-CM

## 2022-09-26 DIAGNOSIS — Z1231 Encounter for screening mammogram for malignant neoplasm of breast: Secondary | ICD-10-CM

## 2022-09-26 DIAGNOSIS — R300 Dysuria: Secondary | ICD-10-CM

## 2022-09-26 DIAGNOSIS — F411 Generalized anxiety disorder: Secondary | ICD-10-CM

## 2022-09-26 DIAGNOSIS — E039 Hypothyroidism, unspecified: Secondary | ICD-10-CM

## 2022-09-26 DIAGNOSIS — E6609 Other obesity due to excess calories: Secondary | ICD-10-CM

## 2022-09-26 DIAGNOSIS — Z0001 Encounter for general adult medical examination with abnormal findings: Secondary | ICD-10-CM

## 2022-11-08 ENCOUNTER — Encounter: Payer: Self-pay | Admitting: Physician Assistant

## 2022-11-08 ENCOUNTER — Ambulatory Visit: Payer: BC Managed Care – PPO | Admitting: Physician Assistant

## 2022-11-08 VITALS — BP 110/85 | HR 98 | Temp 98.2°F | Resp 16 | Ht 69.0 in | Wt 244.4 lb

## 2022-11-08 DIAGNOSIS — E039 Hypothyroidism, unspecified: Secondary | ICD-10-CM

## 2022-11-08 DIAGNOSIS — Z1231 Encounter for screening mammogram for malignant neoplasm of breast: Secondary | ICD-10-CM

## 2022-11-08 DIAGNOSIS — R5383 Other fatigue: Secondary | ICD-10-CM

## 2022-11-08 DIAGNOSIS — F325 Major depressive disorder, single episode, in full remission: Secondary | ICD-10-CM

## 2022-11-08 DIAGNOSIS — Z23 Encounter for immunization: Secondary | ICD-10-CM

## 2022-11-08 DIAGNOSIS — F411 Generalized anxiety disorder: Secondary | ICD-10-CM

## 2022-11-08 DIAGNOSIS — E559 Vitamin D deficiency, unspecified: Secondary | ICD-10-CM | POA: Diagnosis not present

## 2022-11-08 DIAGNOSIS — E782 Mixed hyperlipidemia: Secondary | ICD-10-CM | POA: Diagnosis not present

## 2022-11-08 NOTE — Progress Notes (Signed)
[facility] [address] [place], [place] [address]  Internal MEDICINE  Office Visit Note  Patient Name: Chelsea Sheppard  [id number]  [id number]  Date of Service: 11/08/2022  Chief Complaint  Patient presents with   Follow-up   Quality Metric Gaps    TDAP and Shingles Vaccines    HPI Pt is here for routine follow up -Had her colonoscopy in May, states no problems with this -Shingles vaccines and Tdap due and will schedule these -Feels well with current medications. Only uses xanax occasionally--still has some leftover and does not need more at this time due to infrequent use -Will be due for mammogram in Dec-ordered -Due for pap in 2025 and will schedule with pap in spring. Will also order labs prior to CPE  Current Medication: Outpatient Encounter Medications as of 11/08/2022  Medication Sig   ALPRAZolam (XANAX) 0.5 MG tablet Take 0.5-1 tablet by mouth at bedtime as needed for anxiety   Ascorbic Acid (VITAMIN C) 1000 MG tablet Take 1,000 mg by mouth daily.   atorvastatin (LIPITOR) 10 MG tablet TAKE 1 TABLET DAILY   buPROPion (WELLBUTRIN) 75 MG tablet TAKE 2 TABLETS EVERY MORNING FOR DEPRESSION   cholecalciferol (VITAMIN D3) 25 MCG (1000 UNIT) tablet Take 1,000 Units by mouth daily.   cyanocobalamin (VITAMIN B12) 1000 MCG tablet Take 1,000 mcg by mouth daily.   diclofenac sodium (VOLTAREN) 1 % GEL Apply 4 g topically 4 (four) times daily.   fluticasone (FLONASE) 50 MCG/ACT nasal spray Place 1 spray into both nostrils daily.   Glucosamine 750 MG TABS Take by mouth daily.   Homeopathic Products (ALLERGY MEDICINE PO) Take by mouth daily.   levothyroxine (SYNTHROID) 137 MCG tablet TAKE 1 TABLET DAILY BEFORE BREAKFAST   Multiple Vitamin (MULTIVITAMIN) capsule Take 1 capsule by mouth daily.   No facility-administered encounter medications on file as of 11/08/2022.    Surgical History: Past Surgical History:  Procedure Laterality Date   COLONOSCOPY WITH PROPOFOL  N/A 06/08/2022   Procedure: COLONOSCOPY WITH PROPOFOL;  Surgeon: [name], MD;  Location: [facility];  Service: Endoscopy;  Laterality: N/A;    Medical History: Past Medical History:  Diagnosis Date   Anxiety    Environmental allergies    Hypothyroidism     Family History: Family History  Problem Relation Age of Onset   Diabetes Mother    Thyroid disease Mother    Hypertension Mother    Kidney cancer Mother        tumor on kidney and had kidney removed   Hypertension Father    Prostate cancer Father    Breast cancer Maternal Aunt 96   Breast cancer Maternal Grandmother        mat great gm   Breast cancer Cousin 40       pat cousin    Social History   Socioeconomic History   Marital status: Married    Spouse name: Not on file   Number of children: Not on file   Years of education: Not on file   Highest education level: Not on file  Occupational History   Not on file  Tobacco Use   Smoking status: Former    Types: Cigarettes   Smokeless tobacco: Never  Substance and Sexual Activity   Alcohol use: Not Currently    Alcohol/week: 1.0 - 2.0 standard drink of alcohol    Types: 1 - 2 Standard drinks or equivalent per week    Comment: social   Drug use:  No   Sexual activity: Not on file  Other Topics Concern   Not on file  Social History Narrative   Not on file   Social Determinants of Health   Financial Resource Strain: Not on file  Food Insecurity: Not on file  Transportation Needs: Not on file  Physical Activity: Not on file  Stress: Not on file  Social Connections: Not on file  Intimate Partner Violence: Not on file      Review of Systems  Constitutional:  Negative for chills, fatigue and unexpected weight change.  HENT:  Negative for congestion, postnasal drip, rhinorrhea, sneezing and sore throat.   Eyes:  Negative for redness.  Respiratory:  Negative for cough, chest tightness and shortness of breath.   Cardiovascular:  Negative for  chest pain and palpitations.  Gastrointestinal:  Negative for abdominal pain, constipation, diarrhea, nausea and vomiting.  Genitourinary:  Negative for dysuria and frequency.  Musculoskeletal:  Negative for arthralgias, back pain, joint swelling and neck pain.  Skin:  Negative for rash.  Neurological: Negative.  Negative for tremors and numbness.  Hematological:  Negative for adenopathy. Does not bruise/bleed easily.  Psychiatric/Behavioral:  Negative for behavioral problems (Depression), sleep disturbance and suicidal ideas. The patient is not nervous/anxious.     Vital Signs: BP 110/85   Pulse 98   Temp 98.2 F (36.8 C)   Resp 16   Ht 5\' 9"  (1.753 m)   Wt 244 lb 6.4 oz (110.9 kg)   SpO2 96%   BMI 36.09 kg/m    Physical Exam Vitals and nursing note reviewed.  Constitutional:      General: She is not in acute distress.    Appearance: She is well-developed. She is obese. She is not diaphoretic.  HENT:     Head: Normocephalic and atraumatic.     Mouth/Throat:     Pharynx: No oropharyngeal exudate.  Eyes:     Pupils: Pupils are equal, round, and reactive to light.  Neck:     Thyroid: No thyromegaly.     Vascular: No JVD.     Trachea: No tracheal deviation.  Cardiovascular:     Rate and Rhythm: Normal rate and regular rhythm.     Heart sounds: Normal heart sounds. No murmur heard.    No friction rub. No gallop.  Pulmonary:     Effort: Pulmonary effort is normal. No respiratory distress.     Breath sounds: No wheezing or rales.  Chest:     Chest wall: No tenderness.  Abdominal:     General: Bowel sounds are normal.     Palpations: Abdomen is soft.  Musculoskeletal:        General: Normal range of motion.     Cervical back: Normal range of motion and neck supple.  Lymphadenopathy:     Cervical: No cervical adenopathy.  Skin:    General: Skin is warm and dry.  Neurological:     Mental Status: She is alert and oriented to person, place, and time.     Cranial  Nerves: No cranial nerve deficit.  Psychiatric:        Behavior: Behavior normal.        Thought Content: Thought content normal.        Judgment: Judgment normal.        Assessment/Plan: 1. Major depressive disorder, single episode, in remission (HCC) Stable, continue current medications  2. Generalized anxiety disorder Stable, continue current medications  3. Acquired hypothyroidism Will update labs - TSH +  free T4  4. Visit for screening mammogram - MM 3D SCREENING MAMMOGRAM BILATERAL BREAST; Future  5. Mixed hyperlipidemia - Lipid Panel With LDL/HDL Ratio  6. Vitamin D deficiency - VITAMIN D 25 Hydroxy (Vit-D Deficiency, Fractures)  7. Other fatigue - CBC w/Diff/Platelet - Comprehensive metabolic panel - TSH + free T4 - Lipid Panel With LDL/HDL Ratio - VITAMIN D 25 Hydroxy (Vit-D Deficiency, Fractures)   General Counseling: Clariece verbalizes understanding of the findings of todays visit and agrees with plan of treatment. I have discussed any further diagnostic evaluation that may be needed or ordered today. We also reviewed her medications today. she has been encouraged to call the office with any questions or concerns that should arise related to todays visit.    Orders Placed This Encounter  Procedures   MM 3D SCREENING MAMMOGRAM BILATERAL BREAST   Flu vaccine, recombinant, trivalent, inj   CBC w/Diff/Platelet   Comprehensive metabolic panel   TSH + free T4   Lipid Panel With LDL/HDL Ratio   VITAMIN D 25 Hydroxy (Vit-D Deficiency, Fractures)    No orders of the defined types were placed in this encounter.   This patient was seen by Lynn Ito, PA-C in collaboration with Dr. Beverely Risen as a part of collaborative care agreement.   Total time spent:30 Minutes Time spent includes review of chart, medications, test results, and follow up plan with the patient.      Dr Lyndon Code Internal medicine

## 2023-01-24 ENCOUNTER — Ambulatory Visit
Admission: RE | Admit: 2023-01-24 | Discharge: 2023-01-24 | Disposition: A | Discharge status: Home / Self Care | Payer: BC Managed Care – PPO | Source: Ambulatory Visit | Attending: Physician Assistant | Admitting: Physician Assistant

## 2023-01-24 DIAGNOSIS — Z1231 Encounter for screening mammogram for malignant neoplasm of breast: Secondary | ICD-10-CM | POA: Insufficient documentation

## 2023-03-02 ENCOUNTER — Other Ambulatory Visit: Payer: Self-pay | Admitting: Nurse Practitioner

## 2023-03-02 ENCOUNTER — Other Ambulatory Visit: Payer: Self-pay | Admitting: Physician Assistant

## 2023-03-02 DIAGNOSIS — E7800 Pure hypercholesterolemia, unspecified: Secondary | ICD-10-CM

## 2023-03-02 DIAGNOSIS — F325 Major depressive disorder, single episode, in full remission: Secondary | ICD-10-CM

## 2023-03-02 DIAGNOSIS — F411 Generalized anxiety disorder: Secondary | ICD-10-CM

## 2023-03-02 DIAGNOSIS — E6609 Other obesity due to excess calories: Secondary | ICD-10-CM

## 2023-03-02 DIAGNOSIS — Z0001 Encounter for general adult medical examination with abnormal findings: Secondary | ICD-10-CM

## 2023-03-02 DIAGNOSIS — R300 Dysuria: Secondary | ICD-10-CM

## 2023-03-02 DIAGNOSIS — Z1231 Encounter for screening mammogram for malignant neoplasm of breast: Secondary | ICD-10-CM

## 2023-03-02 DIAGNOSIS — E039 Hypothyroidism, unspecified: Secondary | ICD-10-CM

## 2023-05-17 ENCOUNTER — Other Ambulatory Visit: Payer: BC Managed Care – PPO | Admitting: Physician Assistant

## 2023-05-17 DIAGNOSIS — E039 Hypothyroidism, unspecified: Secondary | ICD-10-CM | POA: Diagnosis not present

## 2023-05-17 DIAGNOSIS — E782 Mixed hyperlipidemia: Secondary | ICD-10-CM | POA: Diagnosis not present

## 2023-05-17 DIAGNOSIS — E559 Vitamin D deficiency, unspecified: Secondary | ICD-10-CM | POA: Diagnosis not present

## 2023-05-18 LAB — COMPREHENSIVE METABOLIC PANEL WITH GFR
ALT: 17 IU/L (ref 0–32)
AST: 18 IU/L (ref 0–40)
Albumin: 4.3 g/dL (ref 3.8–4.9)
Alkaline Phosphatase: 72 IU/L (ref 44–121)
BUN/Creatinine Ratio: 21 (ref 9–23)
BUN: 13 mg/dL (ref 6–24)
Bilirubin Total: 0.5 mg/dL (ref 0.0–1.2)
CO2: 22 mmol/L (ref 20–29)
Calcium: 9.5 mg/dL (ref 8.7–10.2)
Chloride: 101 mmol/L (ref 96–106)
Creatinine, Ser: 0.62 mg/dL (ref 0.57–1.00)
Globulin, Total: 2 g/dL (ref 1.5–4.5)
Glucose: 87 mg/dL (ref 70–99)
Potassium: 4.5 mmol/L (ref 3.5–5.2)
Sodium: 138 mmol/L (ref 134–144)
Total Protein: 6.3 g/dL (ref 6.0–8.5)
eGFR: 104 mL/min/{1.73_m2} (ref >59)

## 2023-05-18 LAB — CBC WITH DIFFERENTIAL/PLATELET
Basophils Absolute: 0.1 10*3/uL (ref 0.0–0.2)
Basos: 1 %
EOS (ABSOLUTE): 0.3 10*3/uL (ref 0.0–0.4)
Eos: 4 %
Hematocrit: 45.1 % (ref 34.0–46.6)
Hemoglobin: 14.4 g/dL (ref 11.1–15.9)
Immature Grans (Abs): 0 10*3/uL (ref 0.0–0.1)
Immature Granulocytes: 0 %
Lymphocytes Absolute: 1.7 10*3/uL (ref 0.7–3.1)
Lymphs: 24 %
MCH: 30.6 pg (ref 26.6–33.0)
MCHC: 31.9 g/dL (ref 31.5–35.7)
MCV: 96 fL (ref 79–97)
Monocytes Absolute: 0.5 10*3/uL (ref 0.1–0.9)
Monocytes: 6 %
Neutrophils Absolute: 4.7 10*3/uL (ref 1.4–7.0)
Neutrophils: 65 %
Platelets: 219 10*3/uL (ref 150–450)
RBC: 4.7 x10E6/uL (ref 3.77–5.28)
RDW: 12.2 % (ref 11.7–15.4)
WBC: 7.3 10*3/uL (ref 3.4–10.8)

## 2023-05-18 LAB — TSH+FREE T4
Free T4: 1.77 ng/dL (ref 0.82–1.77)
TSH: 2.67 u[IU]/mL (ref 0.450–4.500)

## 2023-05-18 LAB — LIPID PANEL WITH LDL/HDL RATIO
Cholesterol, Total: 188 mg/dL (ref 100–199)
HDL: 69 mg/dL (ref >39)
LDL Chol Calc (NIH): 105 mg/dL — ABNORMAL HIGH (ref 0–99)
LDL/HDL Ratio: 1.5 ratio (ref 0.0–3.2)
Triglycerides: 79 mg/dL (ref 0–149)
VLDL Cholesterol Cal: 14 mg/dL (ref 5–40)

## 2023-05-18 LAB — VITAMIN D 25 HYDROXY (VIT D DEFICIENCY, FRACTURES): Vit D, 25-Hydroxy: 36.9 ng/mL (ref 30.0–100.0)

## 2023-05-24 ENCOUNTER — Ambulatory Visit (INDEPENDENT_AMBULATORY_CARE_PROVIDER_SITE_OTHER): Payer: BC Managed Care – PPO | Admitting: Physician Assistant

## 2023-05-24 ENCOUNTER — Encounter: Payer: Self-pay | Admitting: Physician Assistant

## 2023-05-24 VITALS — BP 130/84 | HR 88 | Temp 97.8°F | Resp 16 | Ht 68.0 in | Wt 240.6 lb

## 2023-05-24 DIAGNOSIS — Z0001 Encounter for general adult medical examination with abnormal findings: Secondary | ICD-10-CM

## 2023-05-24 DIAGNOSIS — Z1231 Encounter for screening mammogram for malignant neoplasm of breast: Secondary | ICD-10-CM

## 2023-05-24 DIAGNOSIS — F325 Major depressive disorder, single episode, in full remission: Secondary | ICD-10-CM

## 2023-05-24 DIAGNOSIS — F411 Generalized anxiety disorder: Secondary | ICD-10-CM

## 2023-05-24 DIAGNOSIS — E7800 Pure hypercholesterolemia, unspecified: Secondary | ICD-10-CM | POA: Diagnosis not present

## 2023-05-24 DIAGNOSIS — R300 Dysuria: Secondary | ICD-10-CM

## 2023-05-24 DIAGNOSIS — Z124 Encounter for screening for malignant neoplasm of cervix: Secondary | ICD-10-CM | POA: Diagnosis not present

## 2023-05-24 DIAGNOSIS — E039 Hypothyroidism, unspecified: Secondary | ICD-10-CM | POA: Diagnosis not present

## 2023-05-24 DIAGNOSIS — E66811 Obesity, class 1: Secondary | ICD-10-CM

## 2023-05-24 MED ORDER — ALPRAZOLAM 0.5 MG PO TABS
ORAL_TABLET | ORAL | 0 refills | Status: AC
Start: 2023-05-24 — End: ?

## 2023-05-24 NOTE — Progress Notes (Signed)
 [facility] [address] [place], [place] [address]  Internal MEDICINE  Office Visit Note  Patient Name: Chelsea Sheppard  [id number]  [id number]  Date of Service: 06/04/2023  Chief Complaint  Patient presents with   Annual Exam   Anxiety     HPI Pt is here for routine health maintenance examination -pap due today -labs reviewed: vit D low normal, T4 on upper end of normal-will decrease to 1/2 tab synthroid  once per week. -more anxious recently, requesting alprazolam  refill -LDL up slightly, will continue lipitor. Will try to increase exercise -mammogram done in jan -colonoscopy due in 2034 -has had first shingles vaccine, due for second now  Current Medication: Outpatient Encounter Medications as of 05/24/2023  Medication Sig   Ascorbic Acid (VITAMIN C) 1000 MG tablet Take 1,000 mg by mouth daily.   atorvastatin  (LIPITOR) 10 MG tablet TAKE 1 TABLET DAILY   buPROPion  (WELLBUTRIN ) 75 MG tablet TAKE 2 TABLETS EVERY MORNING FOR DEPRESSION   cholecalciferol (VITAMIN D3) 25 MCG (1000 UNIT) tablet Take 1,000 Units by mouth daily.   cyanocobalamin  (VITAMIN B12) 1000 MCG tablet Take 1,000 mcg by mouth daily.   diclofenac  sodium (VOLTAREN ) 1 % GEL Apply 4 g topically 4 (four) times daily.   fluticasone (FLONASE) 50 MCG/ACT nasal spray Place 1 spray into both nostrils daily.   Glucosamine 750 MG TABS Take by mouth daily.   Homeopathic Products (ALLERGY MEDICINE PO) Take by mouth daily.   levothyroxine  (SYNTHROID ) 137 MCG tablet TAKE 1 TABLET DAILY BEFORE BREAKFAST   Multiple Vitamin (MULTIVITAMIN) capsule Take 1 capsule by mouth daily.   [DISCONTINUED] ALPRAZolam  (XANAX ) 0.5 MG tablet Take 0.5-1 tablet by mouth at bedtime as needed for anxiety   ALPRAZolam  (XANAX ) 0.5 MG tablet Take 0.5-1 tablet by mouth at bedtime as needed for anxiety   No facility-administered encounter medications on file as of 05/24/2023.    Surgical History: Past Surgical History:  Procedure  Laterality Date   COLONOSCOPY WITH PROPOFOL  N/A 06/08/2022   Procedure: COLONOSCOPY WITH PROPOFOL ;  Surgeon: Marnee Sink, MD;  Location: [facility];  Service: Endoscopy;  Laterality: N/A;    Medical History: Past Medical History:  Diagnosis Date   Anxiety    Environmental allergies    Hypothyroidism     Family History: Family History  Problem Relation Age of Onset   Diabetes Mother    Thyroid  disease Mother    Hypertension Mother    Kidney cancer Mother        tumor on kidney and had kidney removed   Hypertension Father    Prostate cancer Father    Breast cancer Maternal Aunt 76   Breast cancer Maternal Grandmother        mat great gm   Breast cancer Cousin 40       pat cousin      Review of Systems  Constitutional:  Negative for chills, fatigue and unexpected weight change.  HENT:  Negative for congestion, postnasal drip, rhinorrhea, sneezing and sore throat.   Eyes:  Negative for redness.  Respiratory:  Negative for cough, chest tightness and shortness of breath.   Cardiovascular:  Negative for chest pain and palpitations.  Gastrointestinal:  Negative for abdominal pain, constipation, diarrhea, nausea and vomiting.  Genitourinary:  Negative for dysuria and frequency.  Musculoskeletal:  Negative for arthralgias, back pain, joint swelling and neck pain.  Skin:  Negative for rash.  Neurological: Negative.  Negative for tremors and numbness.  Hematological:  Negative for adenopathy.  Does not bruise/bleed easily.  Psychiatric/Behavioral:  Negative for behavioral problems (Depression), sleep disturbance and suicidal ideas. The patient is nervous/anxious.      Vital Signs: BP 130/84   Pulse 88   Temp 97.8 F (36.6 C)   Resp 16   Ht 5\' 8"  (1.727 m)   Wt 240 lb 9.6 oz (109.1 kg)   BMI 36.58 kg/m    Physical Exam Vitals and nursing note reviewed.  Constitutional:      General: She is not in acute distress.    Appearance: She is well-developed. She is  obese. She is not diaphoretic.  HENT:     Head: Normocephalic and atraumatic.     Mouth/Throat:     Pharynx: No oropharyngeal exudate.  Eyes:     Pupils: Pupils are equal, round, and reactive to light.  Neck:     Thyroid : No thyromegaly.     Vascular: No JVD.     Trachea: No tracheal deviation.  Cardiovascular:     Rate and Rhythm: Normal rate and regular rhythm.     Heart sounds: Normal heart sounds. No murmur heard.    No friction rub. No gallop.  Pulmonary:     Effort: Pulmonary effort is normal. No respiratory distress.     Breath sounds: No wheezing or rales.  Chest:     Chest wall: No tenderness.  Abdominal:     General: Bowel sounds are normal.     Palpations: Abdomen is soft.     Tenderness: There is no abdominal tenderness.  Genitourinary:    Exam position: Lithotomy position.     Vagina: No tenderness.     Cervix: No discharge.     Comments: Pap performed Musculoskeletal:        General: Normal range of motion.     Cervical back: Normal range of motion and neck supple.  Lymphadenopathy:     Cervical: No cervical adenopathy.  Skin:    General: Skin is warm and dry.  Neurological:     Mental Status: She is alert and oriented to person, place, and time.     Cranial Nerves: No cranial nerve deficit.  Psychiatric:        Behavior: Behavior normal.        Thought Content: Thought content normal.        Judgment: Judgment normal.      LABS: Recent Results (from the past 2160 hours)  CBC w/Diff/Platelet     Status: None   Collection Time: 05/17/23  9:30 AM  Result Value Ref Range   WBC 7.3 3.4 - 10.8 x10E3/uL   RBC 4.70 3.77 - 5.28 x10E6/uL   Hemoglobin 14.4 11.1 - 15.9 g/dL   Hematocrit 01.0 27.2 - 46.6 %   MCV 96 79 - 97 fL   MCH 30.6 26.6 - 33.0 pg   MCHC 31.9 31.5 - 35.7 g/dL   RDW 53.6 64.4 - 03.4 %   Platelets 219 150 - 450 x10E3/uL   Neutrophils 65 Not Estab. %   Lymphs 24 Not Estab. %   Monocytes 6 Not Estab. %   Eos 4 Not Estab. %   Basos 1  Not Estab. %   Neutrophils Absolute 4.7 1.4 - 7.0 x10E3/uL   Lymphocytes Absolute 1.7 0.7 - 3.1 x10E3/uL   Monocytes Absolute 0.5 0.1 - 0.9 x10E3/uL   EOS (ABSOLUTE) 0.3 0.0 - 0.4 x10E3/uL   Basophils Absolute 0.1 0.0 - 0.2 x10E3/uL   Immature Granulocytes 0 Not Estab. %  Immature Grans (Abs) 0.0 0.0 - 0.1 x10E3/uL  Comprehensive metabolic panel     Status: None   Collection Time: 05/17/23  9:30 AM  Result Value Ref Range   Glucose 87 70 - 99 mg/dL   BUN 13 6 - 24 mg/dL   Creatinine, Ser 4.69 0.57 - 1.00 mg/dL   eGFR 629 >52 WU/XLK/4.40   BUN/Creatinine Ratio 21 9 - 23   Sodium 138 134 - 144 mmol/L   Potassium 4.5 3.5 - 5.2 mmol/L   Chloride 101 96 - 106 mmol/L   CO2 22 20 - 29 mmol/L   Calcium  9.5 8.7 - 10.2 mg/dL   Total Protein 6.3 6.0 - 8.5 g/dL   Albumin 4.3 3.8 - 4.9 g/dL   Globulin, Total 2.0 1.5 - 4.5 g/dL   Bilirubin Total 0.5 0.0 - 1.2 mg/dL   Alkaline Phosphatase 72 44 - 121 IU/L   AST 18 0 - 40 IU/L   ALT 17 0 - 32 IU/L  TSH + free T4     Status: None   Collection Time: 05/17/23  9:30 AM  Result Value Ref Range   TSH 2.670 0.450 - 4.500 uIU/mL   Free T4 1.77 0.82 - 1.77 ng/dL  Lipid Panel With LDL/HDL Ratio     Status: Abnormal   Collection Time: 05/17/23  9:30 AM  Result Value Ref Range   Cholesterol, Total 188 100 - 199 mg/dL   Triglycerides 79 0 - 149 mg/dL   HDL 69 >10 mg/dL   VLDL Cholesterol Cal 14 5 - 40 mg/dL   LDL Chol Calc (NIH) 272 (H) 0 - 99 mg/dL   LDL/HDL Ratio 1.5 0.0 - 3.2 ratio    Comment:                                     LDL/HDL Ratio                                             Men  Women                               1/2 Avg.Risk  1.0    1.5                                   Avg.Risk  3.6    3.2                                2X Avg.Risk  6.2    5.0                                3X Avg.Risk  8.0    6.1   VITAMIN D  25 Hydroxy (Vit-D Deficiency, Fractures)     Status: None   Collection Time: 05/17/23  9:30 AM  Result Value Ref Range    Vit D, 25-Hydroxy 36.9 30.0 - 100.0 ng/mL    Comment: Vitamin D  deficiency has been defined by the Institute of Medicine and an Endocrine Society practice guideline as a level of  serum 25-OH vitamin D  less than 20 ng/mL (1,2). The Endocrine Society went on to further define vitamin D  insufficiency as a level between 21 and 29 ng/mL (2). 1. IOM (Institute of Medicine). 2010. Dietary reference    intakes for calcium  and D. [place]: The    [employer]. 2. Holick MF, Binkley , Bischoff-Ferrari HA, et al.    Evaluation, treatment, and prevention of vitamin D     deficiency: an Endocrine Society clinical practice    guideline. JCEM. 2011 Jul; 96(7):1911-30.   IGP, Aptima HPV     Status: Abnormal   Collection Time: 05/24/23 11:52 AM  Result Value Ref Range   Interpretation NILM     Comment: NEGATIVE FOR INTRAEPITHELIAL LESION OR MALIGNANCY.   Category NIL     Comment: Negative for Intraepithelial Lesion   Adequacy ENDO     Comment: Satisfactory for evaluation. Endocervical and/or squamous metaplastic cells (endocervical component) are present.    Clinician Provided ICD10 Comment     Comment: Z12.4   Performed by: Comment     Comment: Lemon Qua, Cytologist (ASCP)   Note: Comment     Comment: The Pap smear is a screening test designed to aid in the detection of premalignant and malignant conditions of the uterine cervix.  It is not a diagnostic procedure and should not be used as the sole means of detecting cervical cancer.  Both false-positive and false-negative reports do occur.    Test Methodology Comment     Comment: This liquid based ThinPrep(R) pap test was interpreted using the Hologic(R) Genius(TM) Cervical AI Algorithm whole slide imaging system.    HPV Aptima Positive (A) Negative    Comment: This nucleic acid amplification test detects fourteen high-risk HPV types (16,18,31,33,35,39,45,51,52,56,58,59,66,68) without differentiation.          Assessment/Plan: 1. Encounter for general adult medical examination with abnormal findings (Primary) CPE performed, labs reviewed, pap done, second shingles vaccine coming up  2. Generalized anxiety disorder Will send refill xanax  prn - ALPRAZolam  (XANAX ) 0.5 MG tablet; Take 0.5-1 tablet by mouth at bedtime as needed for anxiety  Dispense: 30 tablet; Refill: 0  3. Hypothyroidism, unspecified type Will decrease to 1/2 tab synthroid  once per week and full tab remaining day and recheck labs in 6-8 weeks - TSH + free T4  4. Cervical cancer screening - IGP, Aptima HPV  5. Pure hypercholesterolemia Continue lipitor and work on diet and exercise   General Counseling: Maanvi verbalizes understanding of the findings of todays visit and agrees with plan of treatment. I have discussed any further diagnostic evaluation that may be needed or ordered today. We also reviewed her medications today. she has been encouraged to call the office with any questions or concerns that should arise related to todays visit.    Counseling:    Orders Placed This Encounter  Procedures   TSH + free T4    Meds ordered this encounter  Medications   ALPRAZolam  (XANAX ) 0.5 MG tablet    Sig: Take 0.5-1 tablet by mouth at bedtime as needed for anxiety    Dispense:  30 tablet    Refill:  0    This patient was seen by Taylor Favia, PA-C in collaboration with Dr. Verneta Gone as a part of collaborative care agreement.  Total time spent:35 Minutes  Time spent includes review of chart, medications, test results, and follow up plan with the patient.     Lawton Price, MD  Internal Medicine

## 2023-05-30 LAB — IGP, APTIMA HPV: HPV Aptima: POSITIVE — AB

## 2023-05-31 ENCOUNTER — Telehealth: Payer: Self-pay

## 2023-05-31 NOTE — Telephone Encounter (Signed)
-----   Message from Jacques Mattock sent at 05/30/2023 12:52 PM EDT ----- Please let her know that her pap results showed normal cells, however she was positive for HPV, therefore we will need to repeat pap in 1 year.

## 2023-05-31 NOTE — Telephone Encounter (Signed)
Lmom to pt and also sent mychart message

## 2023-07-18 DIAGNOSIS — E039 Hypothyroidism, unspecified: Secondary | ICD-10-CM | POA: Diagnosis not present

## 2023-07-19 LAB — TSH+FREE T4
Free T4: 1.48 ng/dL (ref 0.82–1.77)
TSH: 4.66 u[IU]/mL — ABNORMAL HIGH (ref 0.450–4.500)

## 2023-08-02 ENCOUNTER — Ambulatory Visit: Admitting: Physician Assistant

## 2023-08-02 ENCOUNTER — Encounter: Payer: Self-pay | Admitting: Physician Assistant

## 2023-08-02 VITALS — BP 121/88 | HR 92 | Temp 98.0°F | Resp 16 | Ht 68.0 in | Wt 239.2 lb

## 2023-08-02 DIAGNOSIS — F411 Generalized anxiety disorder: Secondary | ICD-10-CM

## 2023-08-02 DIAGNOSIS — E039 Hypothyroidism, unspecified: Secondary | ICD-10-CM | POA: Diagnosis not present

## 2023-08-02 NOTE — Progress Notes (Signed)
 [facility] [address] [place], [place] [address]  Internal MEDICINE  Office Visit Note  Patient Name: Chelsea Sheppard  [id number]  [id number]  Date of Service: 08/02/2023  Chief Complaint  Patient presents with   Follow-up    Review Labs   Quality Metric Gaps    Shingles Vaccine    HPI Pt is here for routine follow up -Taking 1/2 tab synthroid  1 day per week now, repeat labs show T4 improved, but TSH now borderline elevated -Will continue with current dosing and recheck labs in 2 months to see if stabilizing -07/18/23 had second shingles vaccine after getting blood work, tolerated this one better -doing well with medications  Current Medication: Outpatient Encounter Medications as of 08/02/2023  Medication Sig   ALPRAZolam  (XANAX ) 0.5 MG tablet Take 0.5-1 tablet by mouth at bedtime as needed for anxiety   Ascorbic Acid (VITAMIN C) 1000 MG tablet Take 1,000 mg by mouth daily.   atorvastatin  (LIPITOR) 10 MG tablet TAKE 1 TABLET DAILY   buPROPion  (WELLBUTRIN ) 75 MG tablet TAKE 2 TABLETS EVERY MORNING FOR DEPRESSION   cholecalciferol (VITAMIN D3) 25 MCG (1000 UNIT) tablet Take 1,000 Units by mouth daily.   cyanocobalamin  (VITAMIN B12) 1000 MCG tablet Take 1,000 mcg by mouth daily.   diclofenac  sodium (VOLTAREN ) 1 % GEL Apply 4 g topically 4 (four) times daily.   fluticasone (FLONASE) 50 MCG/ACT nasal spray Place 1 spray into both nostrils daily.   Glucosamine 750 MG TABS Take by mouth daily.   Homeopathic Products (ALLERGY MEDICINE PO) Take by mouth daily.   levothyroxine  (SYNTHROID ) 137 MCG tablet TAKE 1 TABLET DAILY BEFORE BREAKFAST   Multiple Vitamin (MULTIVITAMIN) capsule Take 1 capsule by mouth daily.   No facility-administered encounter medications on file as of 08/02/2023.    Surgical History: Past Surgical History:  Procedure Laterality Date   COLONOSCOPY WITH PROPOFOL  N/A 06/08/2022   Procedure: COLONOSCOPY WITH PROPOFOL ;  Surgeon: Jinny Carmine, MD;   Location: [facility];  Service: Endoscopy;  Laterality: N/A;    Medical History: Past Medical History:  Diagnosis Date   Anxiety    Environmental allergies    Hypothyroidism     Family History: Family History  Problem Relation Age of Onset   Diabetes Mother    Thyroid  disease Mother    Hypertension Mother    Kidney cancer Mother        tumor on kidney and had kidney removed   Hypertension Father    Prostate cancer Father    Breast cancer Maternal Aunt 66   Breast cancer Maternal Grandmother        mat great gm   Breast cancer Cousin 40       pat cousin    Social History   Socioeconomic History   Marital status: Married    Spouse name: Not on file   Number of children: Not on file   Years of education: Not on file   Highest education level: Not on file  Occupational History   Not on file  Tobacco Use   Smoking status: Former    Types: Cigarettes   Smokeless tobacco: Never  Substance and Sexual Activity   Alcohol use: Not Currently    Alcohol/week: 1.0 - 2.0 standard drink of alcohol    Types: 1 - 2 Standard drinks or equivalent per week    Comment: social   Drug use: No   Sexual activity: Not on file  Other Topics Concern   Not  on file  Social History Narrative   Not on file   Social Drivers of Health   Financial Resource Strain: Not on file  Food Insecurity: Not on file  Transportation Needs: Not on file  Physical Activity: Not on file  Stress: Not on file  Social Connections: Not on file  Intimate Partner Violence: Not on file      Review of Systems  Constitutional:  Negative for chills, fatigue and unexpected weight change.  HENT:  Negative for congestion, postnasal drip, rhinorrhea, sneezing and sore throat.   Eyes:  Negative for redness.  Respiratory:  Negative for cough, chest tightness and shortness of breath.   Cardiovascular:  Negative for chest pain and palpitations.  Gastrointestinal:  Negative for abdominal pain,  constipation, diarrhea, nausea and vomiting.  Genitourinary:  Negative for dysuria and frequency.  Musculoskeletal:  Negative for arthralgias, back pain, joint swelling and neck pain.  Skin:  Negative for rash.  Neurological: Negative.  Negative for tremors and numbness.  Hematological:  Negative for adenopathy. Does not bruise/bleed easily.  Psychiatric/Behavioral:  Negative for behavioral problems (Depression), sleep disturbance and suicidal ideas. The patient is nervous/anxious.     Vital Signs: BP 121/88   Pulse 92   Temp 98 F (36.7 C)   Resp 16   Ht 5' 8 (1.727 m)   Wt 239 lb 3.2 oz (108.5 kg)   SpO2 97%   BMI 36.37 kg/m    Physical Exam Vitals and nursing note reviewed.  Constitutional:      General: She is not in acute distress.    Appearance: She is well-developed. She is obese. She is not diaphoretic.  HENT:     Head: Normocephalic and atraumatic.  Eyes:     Extraocular Movements: Extraocular movements intact.  Neck:     Thyroid : No thyromegaly.     Vascular: No JVD.     Trachea: No tracheal deviation.  Cardiovascular:     Rate and Rhythm: Normal rate and regular rhythm.     Heart sounds: Normal heart sounds.  Pulmonary:     Effort: Pulmonary effort is normal. No respiratory distress.     Breath sounds: No wheezing or rales.  Musculoskeletal:        General: Normal range of motion.  Skin:    General: Skin is warm and dry.  Neurological:     Mental Status: She is alert and oriented to person, place, and time.  Psychiatric:        Behavior: Behavior normal.        Thought Content: Thought content normal.        Judgment: Judgment normal.        Assessment/Plan: 1. Acquired hypothyroidism (Primary) Continue 1/2 synthroid  1 day per week and full tab remaining days. Will recheck labs in 2 months. May need to adjust if TSH rising further - TSH + free T4  2. Generalized anxiety disorder Continue current medications   General Counseling: Madai  verbalizes understanding of the findings of todays visit and agrees with plan of treatment. I have discussed any further diagnostic evaluation that may be needed or ordered today. We also reviewed her medications today. she has been encouraged to call the office with any questions or concerns that should arise related to todays visit.    Orders Placed This Encounter  Procedures   TSH + free T4    No orders of the defined types were placed in this encounter.   This patient was seen by  Tinnie Pro, PA-C in collaboration with Dr. Sigrid Bathe as a part of collaborative care agreement.   Total time spent:25 Minutes Time spent includes review of chart, medications, test results, and follow up plan with the patient.      Dr Fozia M Khan Internal medicine

## 2023-09-30 DIAGNOSIS — E039 Hypothyroidism, unspecified: Secondary | ICD-10-CM | POA: Diagnosis not present

## 2023-10-01 LAB — TSH+FREE T4
Free T4: 1.37 ng/dL (ref 0.82–1.77)
TSH: 4.62 u[IU]/mL — ABNORMAL HIGH (ref 0.450–4.500)

## 2023-10-30 ENCOUNTER — Ambulatory Visit: Payer: Self-pay | Admitting: Physician Assistant

## 2023-12-13 ENCOUNTER — Ambulatory Visit: Admitting: Physician Assistant

## 2023-12-13 ENCOUNTER — Encounter: Payer: Self-pay | Admitting: Physician Assistant

## 2023-12-13 DIAGNOSIS — E039 Hypothyroidism, unspecified: Secondary | ICD-10-CM

## 2023-12-13 DIAGNOSIS — F411 Generalized anxiety disorder: Secondary | ICD-10-CM | POA: Diagnosis not present

## 2023-12-13 DIAGNOSIS — F325 Major depressive disorder, single episode, in full remission: Secondary | ICD-10-CM | POA: Diagnosis not present

## 2023-12-13 DIAGNOSIS — E7800 Pure hypercholesterolemia, unspecified: Secondary | ICD-10-CM

## 2023-12-13 MED ORDER — ATORVASTATIN CALCIUM 10 MG PO TABS
10.0000 mg | ORAL_TABLET | Freq: Every day | ORAL | 3 refills | Status: AC
Start: 2023-12-13 — End: ?

## 2023-12-13 MED ORDER — BUPROPION HCL 75 MG PO TABS
ORAL_TABLET | ORAL | 3 refills | Status: AC
Start: 2023-12-13 — End: ?

## 2023-12-13 NOTE — Progress Notes (Signed)
 [facility] [address] [place], [place] [address]  Internal MEDICINE  Office Visit Note  Patient Name: Chelsea Sheppard  [id number]  [id number]  Date of Service: 12/13/2023  Chief Complaint  Patient presents with   Follow-up   Medication Refill    Lipitor    HPI Pt is here for routine follow up -admits she has been drinking more like 1-2 glasses of wine per night -more anxiety/stress recently, husband lost his job, but has found a new one. -Would like to go up on wellbutrin  -Taking 1/2 tablet synthroid  1 day per week, will go back to full tablet since TSH remains elevated and pt having more symptoms of worsening mood and weight difficulty -BP high in office but was rushing  Current Medication: Outpatient Encounter Medications as of 12/13/2023  Medication Sig   ALPRAZolam  (XANAX ) 0.5 MG tablet Take 0.5-1 tablet by mouth at bedtime as needed for anxiety   Ascorbic Acid (VITAMIN C) 1000 MG tablet Take 1,000 mg by mouth daily.   cholecalciferol (VITAMIN D3) 25 MCG (1000 UNIT) tablet Take 1,000 Units by mouth daily.   cyanocobalamin  (VITAMIN B12) 1000 MCG tablet Take 1,000 mcg by mouth daily.   diclofenac  sodium (VOLTAREN ) 1 % GEL Apply 4 g topically 4 (four) times daily.   fluticasone (FLONASE) 50 MCG/ACT nasal spray Place 1 spray into both nostrils daily.   Glucosamine 750 MG TABS Take by mouth daily.   Homeopathic Products (ALLERGY MEDICINE PO) Take by mouth daily.   levothyroxine  (SYNTHROID ) 137 MCG tablet TAKE 1 TABLET DAILY BEFORE BREAKFAST   Multiple Vitamin (MULTIVITAMIN) capsule Take 1 capsule by mouth daily.   [DISCONTINUED] atorvastatin  (LIPITOR) 10 MG tablet TAKE 1 TABLET DAILY   [DISCONTINUED] buPROPion  (WELLBUTRIN ) 75 MG tablet TAKE 2 TABLETS EVERY MORNING FOR DEPRESSION   atorvastatin  (LIPITOR) 10 MG tablet Take 1 tablet (10 mg total) by mouth daily.   buPROPion  (WELLBUTRIN ) 75 MG tablet TAKE 2 TABLETS EVERY MORNING and 1 tablet in PM for depression    No facility-administered encounter medications on file as of 12/13/2023.    Surgical History: Past Surgical History:  Procedure Laterality Date   COLONOSCOPY WITH PROPOFOL  N/A 06/08/2022   Procedure: COLONOSCOPY WITH PROPOFOL ;  Surgeon: Jinny Carmine, MD;  Location: [facility];  Service: Endoscopy;  Laterality: N/A;    Medical History: Past Medical History:  Diagnosis Date   Anxiety    Environmental allergies    Hypothyroidism     Family History: Family History  Problem Relation Age of Onset   Diabetes Mother    Thyroid  disease Mother    Hypertension Mother    Kidney cancer Mother        tumor on kidney and had kidney removed   Hypertension Father    Prostate cancer Father    Breast cancer Maternal Aunt 9   Breast cancer Maternal Grandmother        mat great gm   Breast cancer Cousin 40       pat cousin    Social History   Socioeconomic History   Marital status: Married    Spouse name: Not on file   Number of children: Not on file   Years of education: Not on file   Highest education level: Not on file  Occupational History   Not on file  Tobacco Use   Smoking status: Former    Types: Cigarettes   Smokeless tobacco: Never  Substance and Sexual Activity   Alcohol use: Not Currently  Alcohol/week: 1.0 - 2.0 standard drink of alcohol    Types: 1 - 2 Standard drinks or equivalent per week    Comment: social   Drug use: No   Sexual activity: Not on file  Other Topics Concern   Not on file  Social History Narrative   Not on file   Social Drivers of Health   Financial Resource Strain: Not on file  Food Insecurity: Not on file  Transportation Needs: Not on file  Physical Activity: Not on file  Stress: Not on file  Social Connections: Not on file  Intimate Partner Violence: Not on file      Review of Systems  Constitutional:  Negative for chills, fatigue and unexpected weight change.  HENT:  Negative for congestion, postnasal drip,  rhinorrhea, sneezing and sore throat.   Eyes:  Negative for redness.  Respiratory:  Negative for cough, chest tightness and shortness of breath.   Cardiovascular:  Negative for chest pain and palpitations.  Gastrointestinal:  Negative for abdominal pain, constipation, diarrhea, nausea and vomiting.  Genitourinary:  Negative for dysuria and frequency.  Musculoskeletal:  Negative for arthralgias, back pain, joint swelling and neck pain.  Skin:  Negative for rash.  Neurological: Negative.  Negative for tremors and numbness.  Hematological:  Negative for adenopathy. Does not bruise/bleed easily.  Psychiatric/Behavioral:  Positive for dysphoric mood. Negative for behavioral problems (Depression), sleep disturbance and suicidal ideas. The patient is nervous/anxious.     Vital Signs: BP 118/80   Pulse 93   Temp 98 F (36.7 C)   Resp 16   Ht 5' 8 (1.727 m)   Wt 252 lb (114.3 kg)   SpO2 94%   BMI 38.32 kg/m    Physical Exam Vitals and nursing note reviewed.  Constitutional:      General: She is not in acute distress.    Appearance: She is well-developed. She is obese. She is not diaphoretic.  HENT:     Head: Normocephalic and atraumatic.  Eyes:     Extraocular Movements: Extraocular movements intact.  Neck:     Thyroid : No thyromegaly.     Vascular: No JVD.     Trachea: No tracheal deviation.  Cardiovascular:     Rate and Rhythm: Normal rate and regular rhythm.     Heart sounds: Normal heart sounds.  Pulmonary:     Effort: Pulmonary effort is normal. No respiratory distress.     Breath sounds: No wheezing or rales.  Musculoskeletal:        General: Normal range of motion.  Skin:    General: Skin is warm and dry.  Neurological:     Mental Status: She is alert and oriented to person, place, and time.  Psychiatric:        Behavior: Behavior normal.        Thought Content: Thought content normal.        Judgment: Judgment normal.        Assessment/Plan: 1.  Hypothyroidism, unspecified type Will go back up to full tab synthroid  daily and recheck labs in 2 weeks - TSH + free T4  2. Major depressive disorder, single episode, in remission Will increase to 2 tabs in AM and 1 at night, pt previously did well on this dose - buPROPion  (WELLBUTRIN ) 75 MG tablet; TAKE 2 TABLETS EVERY MORNING and 1 tablet in PM for depression  Dispense: 270 tablet; Refill: 3  3. Generalized anxiety disorder Will increase wellbutrin    4. Pure hypercholesterolemia - atorvastatin  (LIPITOR)  10 MG tablet; Take 1 tablet (10 mg total) by mouth daily.  Dispense: 90 tablet; Refill: 3   General Counseling: Tnya verbalizes understanding of the findings of todays visit and agrees with plan of treatment. I have discussed any further diagnostic evaluation that may be needed or ordered today. We also reviewed her medications today. she has been encouraged to call the office with any questions or concerns that should arise related to todays visit.    Orders Placed This Encounter  Procedures   TSH + free T4    Meds ordered this encounter  Medications   atorvastatin  (LIPITOR) 10 MG tablet    Sig: Take 1 tablet (10 mg total) by mouth daily.    Dispense:  90 tablet    Refill:  3   buPROPion  (WELLBUTRIN ) 75 MG tablet    Sig: TAKE 2 TABLETS EVERY MORNING and 1 tablet in PM for depression    Dispense:  270 tablet    Refill:  3    This patient was seen by Tinnie Pro, PA-C in collaboration with Dr. Sigrid Bathe as a part of collaborative care agreement.   Total time spent:30 Minutes Time spent includes review of chart, medications, test results, and follow up plan with the patient.      Dr Fozia M Khan Internal medicine

## 2024-01-10 ENCOUNTER — Ambulatory Visit: Admitting: Physician Assistant

## 2024-01-24 ENCOUNTER — Ambulatory Visit: Admitting: Physician Assistant

## 2024-01-24 ENCOUNTER — Encounter: Payer: Self-pay | Admitting: Physician Assistant

## 2024-01-24 VITALS — BP 110/68 | HR 97 | Temp 98.0°F | Resp 16 | Ht 68.0 in | Wt 244.0 lb

## 2024-01-24 DIAGNOSIS — E039 Hypothyroidism, unspecified: Secondary | ICD-10-CM | POA: Diagnosis not present

## 2024-01-24 DIAGNOSIS — Z1231 Encounter for screening mammogram for malignant neoplasm of breast: Secondary | ICD-10-CM | POA: Diagnosis not present

## 2024-01-24 DIAGNOSIS — F411 Generalized anxiety disorder: Secondary | ICD-10-CM | POA: Diagnosis not present

## 2024-01-24 DIAGNOSIS — F325 Major depressive disorder, single episode, in full remission: Secondary | ICD-10-CM | POA: Diagnosis not present

## 2024-01-24 NOTE — Progress Notes (Signed)
 [facility] [address] [place], [place] [address]  Internal MEDICINE  Office Visit Note  Patient Name: Chelsea Sheppard  [id number]  [id number]  Date of Service: 01/24/2024  Chief Complaint  Patient presents with   Follow-up   Hypertension   Quality Metric Gaps    Pneumonia vaccine    HPI Pt is here for routine follow up -has been off work for 2 weeks and thinks this is helping stress -BP doing well -Wellbutrin  increase is helping as well -decreased alcohol use in evening and is sleeping better now and hopes to continue this when she goes back to work as well -increased synthroid  to full tab daily last visit and will have repeat lab  Current Medication: Outpatient Encounter Medications as of 01/24/2024  Medication Sig   ALPRAZolam  (XANAX ) 0.5 MG tablet Take 0.5-1 tablet by mouth at bedtime as needed for anxiety   Ascorbic Acid (VITAMIN C) 1000 MG tablet Take 1,000 mg by mouth daily.   atorvastatin  (LIPITOR) 10 MG tablet Take 1 tablet (10 mg total) by mouth daily.   buPROPion  (WELLBUTRIN ) 75 MG tablet TAKE 2 TABLETS EVERY MORNING and 1 tablet in PM for depression   cholecalciferol (VITAMIN D3) 25 MCG (1000 UNIT) tablet Take 1,000 Units by mouth daily.   cyanocobalamin  (VITAMIN B12) 1000 MCG tablet Take 1,000 mcg by mouth daily.   diclofenac  sodium (VOLTAREN ) 1 % GEL Apply 4 g topically 4 (four) times daily.   fluticasone (FLONASE) 50 MCG/ACT nasal spray Place 1 spray into both nostrils daily.   Glucosamine 750 MG TABS Take by mouth daily.   Homeopathic Products (ALLERGY MEDICINE PO) Take by mouth daily.   levothyroxine  (SYNTHROID ) 137 MCG tablet TAKE 1 TABLET DAILY BEFORE BREAKFAST   Multiple Vitamin (MULTIVITAMIN) capsule Take 1 capsule by mouth daily.   No facility-administered encounter medications on file as of 01/24/2024.    Surgical History: Past Surgical History:  Procedure Laterality Date   COLONOSCOPY WITH PROPOFOL  N/A 06/08/2022   Procedure: COLONOSCOPY  WITH PROPOFOL ;  Surgeon: Jinny Carmine, MD;  Location: [facility];  Service: Endoscopy;  Laterality: N/A;    Medical History: Past Medical History:  Diagnosis Date   Anxiety    Environmental allergies    Hypothyroidism     Family History: Family History  Problem Relation Age of Onset   Diabetes Mother    Thyroid  disease Mother    Hypertension Mother    Kidney cancer Mother        tumor on kidney and had kidney removed   Hypertension Father    Prostate cancer Father    Breast cancer Maternal Aunt 70   Breast cancer Maternal Grandmother        mat great gm   Breast cancer Cousin 40       pat cousin    Social History   Socioeconomic History   Marital status: Married    Spouse name: Not on file   Number of children: Not on file   Years of education: Not on file   Highest education level: Not on file  Occupational History   Not on file  Tobacco Use   Smoking status: Former    Types: Cigarettes   Smokeless tobacco: Never  Substance and Sexual Activity   Alcohol use: Not Currently    Alcohol/week: 1.0 - 2.0 standard drink of alcohol    Types: 1 - 2 Standard drinks or equivalent per week    Comment: social   Drug use:  No   Sexual activity: Not on file  Other Topics Concern   Not on file  Social History Narrative   Not on file   Social Drivers of Health   Tobacco Use: Medium Risk (01/24/2024)   Patient History    Smoking Tobacco Use: Former    Smokeless Tobacco Use: Never    Passive Exposure: Not on Actuary Strain: Not on file  Food Insecurity: Not on file  Transportation Needs: Not on file  Physical Activity: Not on file  Stress: Not on file  Social Connections: Not on file  Intimate Partner Violence: Not on file  Depression (PHQ2-9): Low Risk (01/24/2024)   Depression (PHQ2-9)    PHQ-2 Score: 0  Alcohol Screen: Low Risk (01/24/2024)   Alcohol Screen    Last Alcohol Screening Score (AUDIT): 2  Housing: Not on file  Utilities: Not  on file  Health Literacy: Not on file      Review of Systems  Constitutional:  Negative for chills, fatigue and unexpected weight change.  HENT:  Negative for congestion, postnasal drip, rhinorrhea, sneezing and sore throat.   Eyes:  Negative for redness.  Respiratory:  Negative for cough, chest tightness and shortness of breath.   Cardiovascular:  Negative for chest pain and palpitations.  Gastrointestinal:  Negative for abdominal pain, constipation, diarrhea, nausea and vomiting.  Genitourinary:  Negative for dysuria and frequency.  Musculoskeletal:  Negative for arthralgias, back pain, joint swelling and neck pain.  Skin:  Negative for rash.  Neurological: Negative.  Negative for tremors and numbness.  Hematological:  Negative for adenopathy. Does not bruise/bleed easily.  Psychiatric/Behavioral:  Negative for behavioral problems (Depression), sleep disturbance and suicidal ideas. The patient is nervous/anxious.     Vital Signs: BP 110/68   Pulse 97   Temp 98 F (36.7 C)   Resp 16   Ht 5' 8 (1.727 m)   Wt 244 lb (110.7 kg)   SpO2 95%   BMI 37.10 kg/m    Physical Exam Vitals and nursing note reviewed.  Constitutional:      General: She is not in acute distress.    Appearance: She is well-developed. She is obese. She is not diaphoretic.  HENT:     Head: Normocephalic and atraumatic.  Eyes:     Extraocular Movements: Extraocular movements intact.  Neck:     Thyroid : No thyromegaly.     Vascular: No JVD.     Trachea: No tracheal deviation.  Cardiovascular:     Rate and Rhythm: Normal rate and regular rhythm.     Heart sounds: Normal heart sounds.  Pulmonary:     Effort: Pulmonary effort is normal. No respiratory distress.     Breath sounds: No wheezing or rales.  Musculoskeletal:        General: Normal range of motion.  Skin:    General: Skin is warm and dry.  Neurological:     Mental Status: She is alert and oriented to person, place, and time.   Psychiatric:        Behavior: Behavior normal.        Thought Content: Thought content normal.        Judgment: Judgment normal.        Assessment/Plan: 1. Acquired hypothyroidism (Primary) Continue synthroid  and will repeat labs for monitoring  2. Major depressive disorder, single episode, in remission Improving, continue current medications  3. Generalized anxiety disorder Improving, continue current medications  4. Visit for screening mammogram - MM  3D SCREENING MAMMOGRAM BILATERAL BREAST; Future   General Counseling: Tiffanyann verbalizes understanding of the findings of todays visit and agrees with plan of treatment. I have discussed any further diagnostic evaluation that may be needed or ordered today. We also reviewed her medications today. she has been encouraged to call the office with any questions or concerns that should arise related to todays visit.    No orders of the defined types were placed in this encounter.   No orders of the defined types were placed in this encounter.   This patient was seen by Tinnie Pro, PA-C in collaboration with Dr. Sigrid Bathe as a part of collaborative care agreement.   Total time spent:30 Minutes Time spent includes review of chart, medications, test results, and follow up plan with the patient.      Dr Fozia M Khan Internal medicine

## 2024-02-15 LAB — TSH+FREE T4
Free T4: 1.45 ng/dL (ref 0.82–1.77)
TSH: 3.21 u[IU]/mL (ref 0.450–4.500)

## 2024-02-17 ENCOUNTER — Ambulatory Visit

## 2024-02-21 ENCOUNTER — Ambulatory Visit: Payer: Self-pay | Admitting: Physician Assistant

## 2024-02-25 NOTE — Progress Notes (Signed)
Pt notified for labs  

## 2024-02-25 NOTE — Telephone Encounter (Signed)
 Lmom to call us back

## 2024-03-02 ENCOUNTER — Ambulatory Visit

## 2024-05-25 ENCOUNTER — Encounter: Admitting: Physician Assistant
